# Patient Record
Sex: Female | Born: 1935
Health system: Southern US, Community
[De-identification: ages and names within clinical notes are randomized; demographics above are authoritative.]

## PROBLEM LIST (undated history)

## (undated) DIAGNOSIS — C4360 Malignant melanoma of unspecified upper limb, including shoulder: Secondary | ICD-10-CM

## (undated) DIAGNOSIS — C3492 Malignant neoplasm of unspecified part of left bronchus or lung: Secondary | ICD-10-CM

## (undated) DIAGNOSIS — R0609 Other forms of dyspnea: Secondary | ICD-10-CM

## (undated) DIAGNOSIS — E7800 Pure hypercholesterolemia, unspecified: Secondary | ICD-10-CM

## (undated) DIAGNOSIS — R112 Nausea with vomiting, unspecified: Secondary | ICD-10-CM

## (undated) DIAGNOSIS — C801 Malignant (primary) neoplasm, unspecified: Secondary | ICD-10-CM

## (undated) DIAGNOSIS — E669 Obesity, unspecified: Secondary | ICD-10-CM

## (undated) DIAGNOSIS — Z9889 Other specified postprocedural states: Secondary | ICD-10-CM

## (undated) DIAGNOSIS — F329 Major depressive disorder, single episode, unspecified: Secondary | ICD-10-CM

## (undated) DIAGNOSIS — K219 Gastro-esophageal reflux disease without esophagitis: Secondary | ICD-10-CM

## (undated) DIAGNOSIS — C3490 Malignant neoplasm of unspecified part of unspecified bronchus or lung: Secondary | ICD-10-CM

## (undated) DIAGNOSIS — F32A Depression, unspecified: Secondary | ICD-10-CM

## (undated) DIAGNOSIS — I10 Essential (primary) hypertension: Secondary | ICD-10-CM

## (undated) DIAGNOSIS — R918 Other nonspecific abnormal finding of lung field: Secondary | ICD-10-CM

## (undated) DIAGNOSIS — Z8719 Personal history of other diseases of the digestive system: Secondary | ICD-10-CM

## (undated) DIAGNOSIS — I2510 Atherosclerotic heart disease of native coronary artery without angina pectoris: Secondary | ICD-10-CM

## (undated) DIAGNOSIS — E119 Type 2 diabetes mellitus without complications: Secondary | ICD-10-CM

## (undated) DIAGNOSIS — M1990 Unspecified osteoarthritis, unspecified site: Secondary | ICD-10-CM

## (undated) DIAGNOSIS — M549 Dorsalgia, unspecified: Secondary | ICD-10-CM

## (undated) DIAGNOSIS — G8929 Other chronic pain: Secondary | ICD-10-CM

## (undated) DIAGNOSIS — C4491 Basal cell carcinoma of skin, unspecified: Secondary | ICD-10-CM

## (undated) DIAGNOSIS — F419 Anxiety disorder, unspecified: Secondary | ICD-10-CM

## (undated) DIAGNOSIS — Z9289 Personal history of other medical treatment: Secondary | ICD-10-CM

## (undated) DIAGNOSIS — R0600 Dyspnea, unspecified: Secondary | ICD-10-CM

## (undated) HISTORY — DX: Dyspnea, unspecified: R06.00

## (undated) HISTORY — PX: CHOLECYSTECTOMY OPEN: SUR202

## (undated) HISTORY — PX: MELANOMA EXCISION: SHX5266

## (undated) HISTORY — PX: BACK SURGERY: SHX140

## (undated) HISTORY — DX: Gastro-esophageal reflux disease without esophagitis: K21.9

## (undated) HISTORY — PX: DILATION AND CURETTAGE OF UTERUS: SHX78

## (undated) HISTORY — DX: Obesity, unspecified: E66.9

## (undated) HISTORY — DX: Other nonspecific abnormal finding of lung field: R91.8

## (undated) HISTORY — DX: Essential (primary) hypertension: I10

## (undated) HISTORY — DX: Major depressive disorder, single episode, unspecified: F32.9

## (undated) HISTORY — PX: BREAST BIOPSY: SHX20

## (undated) HISTORY — DX: Atherosclerotic heart disease of native coronary artery without angina pectoris: I25.10

## (undated) HISTORY — DX: Anxiety disorder, unspecified: F41.9

## (undated) HISTORY — DX: Other forms of dyspnea: R06.09

## (undated) HISTORY — DX: Pure hypercholesterolemia, unspecified: E78.00

## (undated) HISTORY — PX: BREAST LUMPECTOMY: SHX2

## (undated) HISTORY — PX: CARPAL TUNNEL RELEASE: SHX101

## (undated) HISTORY — DX: Malignant neoplasm of unspecified part of left bronchus or lung: C34.92

## (undated) HISTORY — PX: VAGINAL HYSTERECTOMY: SUR661

## (undated) HISTORY — DX: Depression, unspecified: F32.A

## (undated) HISTORY — PX: TONSILLECTOMY: SUR1361

## (undated) HISTORY — PX: CATARACT EXTRACTION W/ INTRAOCULAR LENS  IMPLANT, BILATERAL: SHX1307

---

## 2003-12-11 HISTORY — PX: CARDIAC CATHETERIZATION: SHX172

## 2015-06-05 DIAGNOSIS — E119 Type 2 diabetes mellitus without complications: Secondary | ICD-10-CM | POA: Diagnosis not present

## 2015-06-05 DIAGNOSIS — E782 Mixed hyperlipidemia: Secondary | ICD-10-CM | POA: Diagnosis not present

## 2015-06-05 DIAGNOSIS — I10 Essential (primary) hypertension: Secondary | ICD-10-CM | POA: Diagnosis not present

## 2015-06-05 DIAGNOSIS — K219 Gastro-esophageal reflux disease without esophagitis: Secondary | ICD-10-CM | POA: Diagnosis not present

## 2015-06-05 DIAGNOSIS — M1712 Unilateral primary osteoarthritis, left knee: Secondary | ICD-10-CM | POA: Diagnosis not present

## 2015-09-25 ENCOUNTER — Encounter: Payer: Self-pay | Admitting: *Deleted

## 2015-09-29 ENCOUNTER — Encounter: Payer: Self-pay | Admitting: *Deleted

## 2015-09-30 ENCOUNTER — Ambulatory Visit (INDEPENDENT_AMBULATORY_CARE_PROVIDER_SITE_OTHER): Payer: Commercial Managed Care - HMO | Admitting: Cardiovascular Disease

## 2015-09-30 ENCOUNTER — Encounter: Payer: Self-pay | Admitting: Cardiovascular Disease

## 2015-09-30 VITALS — BP 142/64 | HR 55 | Ht 68.0 in | Wt 230.0 lb

## 2015-09-30 DIAGNOSIS — I10 Essential (primary) hypertension: Secondary | ICD-10-CM | POA: Diagnosis not present

## 2015-09-30 DIAGNOSIS — R931 Abnormal findings on diagnostic imaging of heart and coronary circulation: Secondary | ICD-10-CM

## 2015-09-30 DIAGNOSIS — R0602 Shortness of breath: Secondary | ICD-10-CM | POA: Diagnosis not present

## 2015-09-30 DIAGNOSIS — E785 Hyperlipidemia, unspecified: Secondary | ICD-10-CM

## 2015-09-30 DIAGNOSIS — Z01818 Encounter for other preprocedural examination: Secondary | ICD-10-CM

## 2015-09-30 NOTE — Progress Notes (Signed)
Patient ID: Christina Estes, female   DOB: [birth year], 81 y.o.   MRN: [id number]       CARDIOLOGY CONSULT NOTE  Patient ID: Christina Estes MRN: [id number] DOB/AGE: 06/10/1935 81 y.o.  Admit date: (Not on file) Primary Physician [name], MD  Reason for Consultation: preop clearance, abnormal stress test  HPI: The patient is a 81 year old woman with a history of hypertension, hyperlipidemia , and diabetes mellitus who has been referred for preoperative risk stratification prior to undergoing knee surgery in the context of an abnormal nuclear stress test. She is planning on undergoing left total knee replacement.  Nuclear stress test on 09/15/15 demonstrated a small, mild reversible defect in the basal inferolateral region. Calculated LVEF 68%.  Echocardiogram on 02/08/13 performed in [place], [place], demonstrated vigorous left ventricular systolic function, LVEF 40-81%, with mild diastolic dysfunction.  Coronary angiography in April 2005 demonstrated mild plaque disease in the LAD and proximal left circumflex (10-20%) with 20-30% stenosis in a second diagonal branch.  ECG performed in the office today demonstrates sinus bradycardia, heart rate 47 bpm.  She denies chest pain, orthopnea, palpitations , and paroxysmal nocturnal dyspnea. She has chronic exertional dyspnea which has been unchanged over several years. She also attributes shortness of breath to back problems and bilateral knee pain.  Soc: Moved to [place] in 04/2015 from [place]. Raised in [facility]. Son died in 11/01/03, brother died in 11-01-14.  No Known Allergies  Current Outpatient Prescriptions  Medication Sig Dispense Refill  . ALPRAZolam (XANAX) 1 MG tablet Take 1 mg by mouth at bedtime as needed for anxiety.    Marland Kitchen aspirin 81 MG tablet Take 81 mg by mouth daily.    Marland Kitchen atorvastatin (LIPITOR) 40 MG tablet Take 40 mg by mouth daily.    . carvedilol (COREG) 6.25 MG tablet Take 6.25 mg by mouth 2 (two) times daily  with a meal.    . gabapentin (NEURONTIN) 400 MG capsule Take 400 mg by mouth at bedtime.    Marland Kitchen losartan-hydrochlorothiazide (HYZAAR) 100-25 MG tablet Take 1 tablet by mouth daily.    . metFORMIN (GLUCOPHAGE) 500 MG tablet Take by mouth 2 (two) times daily with a meal.    . omeprazole (PRILOSEC) 20 MG capsule Take 20 mg by mouth daily.    . Potassium 99 MG TABS Take by mouth.    . traMADol (ULTRAM) 50 MG tablet Take 50 mg by mouth every 6 (six) hours as needed.    . vitamin B-12 (CYANOCOBALAMIN) 1000 MCG tablet Take 1,000 mcg by mouth daily.     No current facility-administered medications for this visit.    Past Medical History  Diagnosis Date  . Coronary artery disease   . DOE (dyspnea on exertion)   . Hypercholesteremia   . Hypertension   . GERD (gastroesophageal reflux disease)   . Obesity   . Anxiety   . Depression     Past Surgical History  Procedure Laterality Date  . Cardiac catheterization  12/11/2003  . Cholecystectomy      Social History   Social History  . Marital Status: Married    Spouse Name: N/A  . Number of Children: N/A  . Years of Education: N/A   Occupational History  . Not on file.   Social History Main Topics  . Smoking status: Never Smoker   . Smokeless tobacco: Never Used  . Alcohol Use: No  . Drug Use: Not on file  . Sexual Activity: Not on file  Other Topics Concern  . Not on file   Social History Narrative     No family history of premature CAD in 1st degree relatives.  Prior to Admission medications   Medication Sig Start Date End Date Taking? Authorizing Provider  ALPRAZolam Duanne Moron) 1 MG tablet Take 1 mg by mouth at bedtime as needed for anxiety.    Historical Provider, MD  atorvastatin (LIPITOR) 40 MG tablet Take 40 mg by mouth daily.    Historical Provider, MD  carvedilol (COREG) 6.25 MG tablet Take 6.25 mg by mouth 2 (two) times daily with a meal.    Historical Provider, MD  losartan-hydrochlorothiazide (HYZAAR) 100-25 MG  tablet Take 1 tablet by mouth daily.    Historical Provider, MD  metFORMIN (GLUCOPHAGE) 500 MG tablet Take by mouth 2 (two) times daily with a meal.    Historical Provider, MD  omeprazole (PRILOSEC) 20 MG capsule Take 20 mg by mouth daily.    Historical Provider, MD  traMADol (ULTRAM) 50 MG tablet Take 50 mg by mouth every 6 (six) hours as needed.    Historical Provider, MD     Review of systems complete and found to be negative unless listed above in HPI     Physical exam Blood pressure 142/64, pulse 55, height '5\' 8"'$  (1.727 m), weight 230 lb (104.327 kg), SpO2 98 %. General: NAD Neck: No JVD, no thyromegaly or thyroid nodule.  Lungs: Clear to auscultation bilaterally with normal respiratory effort. CV: Nondisplaced PMI. Regular rate and rhythm, normal S1/S2, no S3/S4, no murmur.  No peripheral edema.  No carotid bruit.  Normal pedal pulses.  Abdomen: Soft, nontender, obese. Skin: Intact without lesions or rashes.  Neurologic: Alert and oriented x 3.  Psych: Normal affect. Extremities: No clubbing or cyanosis.  HEENT: Normal.   ECG: Most recent ECG reviewed.  Labs:  No results found for: WBC, HGB, HCT, MCV, PLT No results for input(s): NA, K, CL, CO2, BUN, CREATININE, CALCIUM, PROT, BILITOT, ALKPHOS, ALT, AST, GLUCOSE in the last 168 hours.  Invalid input(s): LABALBU No results found for: CKTOTAL, CKMB, CKMBINDEX, TROPONINI No results found for: CHOL No results found for: HDL No results found for: LDLCALC No results found for: TRIG No results found for: CHOLHDL No results found for: LDLDIRECT       Studies: No results found.  ASSESSMENT AND PLAN:  1. Abnormal nuclear stress test: Given the small area of mild reversibility with normal LV systolic function, it appears to be a low risk study for which medical management is warranted. Will obtain an echocardiogram to assess for interval changes in cardiac structure and function. CAD was mild by cath in 2005. Continue ASA,  Coreg, and Lipitor.  2. Essential HTN: Mildly elevated today. Will monitor.  3. Hyperlipidemia: Continue Lipitor 40 mg.  4. Preoperative risk stratification: She can proceed with knee surgery as planned at an acceptable level of risk. Will obtain an echocardiogram to assess for interval changes in cardiac structure and function.  5. Shortness of breath: Appears to be chronic and unchanged. Will obtain an echocardiogram to assess for interval changes in cardiac structure and function.  Dispo: f/u 6 months.   Signed: Kate Sable, M.D., F.A.C.C.  09/30/2015, 2:47 PM

## 2015-10-01 ENCOUNTER — Other Ambulatory Visit: Payer: Self-pay

## 2015-10-01 ENCOUNTER — Ambulatory Visit (INDEPENDENT_AMBULATORY_CARE_PROVIDER_SITE_OTHER): Payer: Commercial Managed Care - HMO

## 2015-10-01 DIAGNOSIS — R0602 Shortness of breath: Secondary | ICD-10-CM

## 2015-10-01 DIAGNOSIS — R931 Abnormal findings on diagnostic imaging of heart and coronary circulation: Secondary | ICD-10-CM

## 2015-10-02 ENCOUNTER — Telehealth: Payer: Self-pay | Admitting: *Deleted

## 2015-10-02 NOTE — Telephone Encounter (Signed)
Notes Recorded by Laurine Blazer, LPN on 04/02/5908 at 3:11 PM Patient notified. Copy to pmd & Dr. Case.

## 2015-10-02 NOTE — Telephone Encounter (Signed)
-----   Message from [place] sent at 10/02/2015 11:46 AM EST -----   ----- Message -----    From: Herminio Commons, MD    Sent: 10/02/2015  11:26 AM      To: Massie Maroon, CMA  Normal pumping function with some stiffness with relaxation. Manage medically.

## 2016-03-30 ENCOUNTER — Ambulatory Visit: Payer: Commercial Managed Care - HMO | Admitting: Cardiovascular Disease

## 2016-04-22 ENCOUNTER — Encounter (HOSPITAL_COMMUNITY): Payer: Commercial Managed Care - HMO | Attending: Oncology | Admitting: Oncology

## 2016-04-22 ENCOUNTER — Encounter (HOSPITAL_COMMUNITY): Payer: Self-pay | Admitting: Oncology

## 2016-04-22 VITALS — BP 140/75 | HR 68 | Temp 98.3°F | Resp 18 | Ht 68.0 in | Wt 220.0 lb

## 2016-04-22 DIAGNOSIS — Z9049 Acquired absence of other specified parts of digestive tract: Secondary | ICD-10-CM | POA: Insufficient documentation

## 2016-04-22 DIAGNOSIS — S22000A Wedge compression fracture of unspecified thoracic vertebra, initial encounter for closed fracture: Secondary | ICD-10-CM

## 2016-04-22 DIAGNOSIS — M545 Low back pain: Secondary | ICD-10-CM | POA: Insufficient documentation

## 2016-04-22 DIAGNOSIS — M546 Pain in thoracic spine: Secondary | ICD-10-CM | POA: Insufficient documentation

## 2016-04-22 DIAGNOSIS — C7951 Secondary malignant neoplasm of bone: Secondary | ICD-10-CM | POA: Insufficient documentation

## 2016-04-22 DIAGNOSIS — F329 Major depressive disorder, single episode, unspecified: Secondary | ICD-10-CM | POA: Insufficient documentation

## 2016-04-22 DIAGNOSIS — R627 Adult failure to thrive: Secondary | ICD-10-CM | POA: Insufficient documentation

## 2016-04-22 DIAGNOSIS — R634 Abnormal weight loss: Secondary | ICD-10-CM

## 2016-04-22 DIAGNOSIS — E7800 Pure hypercholesterolemia, unspecified: Secondary | ICD-10-CM | POA: Insufficient documentation

## 2016-04-22 DIAGNOSIS — K259 Gastric ulcer, unspecified as acute or chronic, without hemorrhage or perforation: Secondary | ICD-10-CM | POA: Insufficient documentation

## 2016-04-22 DIAGNOSIS — Z85828 Personal history of other malignant neoplasm of skin: Secondary | ICD-10-CM

## 2016-04-22 DIAGNOSIS — R1310 Dysphagia, unspecified: Secondary | ICD-10-CM | POA: Insufficient documentation

## 2016-04-22 DIAGNOSIS — R339 Retention of urine, unspecified: Secondary | ICD-10-CM | POA: Insufficient documentation

## 2016-04-22 DIAGNOSIS — R918 Other nonspecific abnormal finding of lung field: Secondary | ICD-10-CM | POA: Diagnosis not present

## 2016-04-22 DIAGNOSIS — E119 Type 2 diabetes mellitus without complications: Secondary | ICD-10-CM | POA: Insufficient documentation

## 2016-04-22 DIAGNOSIS — Z8711 Personal history of peptic ulcer disease: Secondary | ICD-10-CM | POA: Insufficient documentation

## 2016-04-22 DIAGNOSIS — C3492 Malignant neoplasm of unspecified part of left bronchus or lung: Secondary | ICD-10-CM | POA: Insufficient documentation

## 2016-04-22 DIAGNOSIS — C4362 Malignant melanoma of left upper limb, including shoulder: Secondary | ICD-10-CM | POA: Insufficient documentation

## 2016-04-22 DIAGNOSIS — I2510 Atherosclerotic heart disease of native coronary artery without angina pectoris: Secondary | ICD-10-CM | POA: Insufficient documentation

## 2016-04-22 DIAGNOSIS — N179 Acute kidney failure, unspecified: Secondary | ICD-10-CM | POA: Insufficient documentation

## 2016-04-22 DIAGNOSIS — Z7984 Long term (current) use of oral hypoglycemic drugs: Secondary | ICD-10-CM | POA: Insufficient documentation

## 2016-04-22 DIAGNOSIS — F419 Anxiety disorder, unspecified: Secondary | ICD-10-CM | POA: Insufficient documentation

## 2016-04-22 DIAGNOSIS — K219 Gastro-esophageal reflux disease without esophagitis: Secondary | ICD-10-CM | POA: Insufficient documentation

## 2016-04-22 DIAGNOSIS — K829 Disease of gallbladder, unspecified: Secondary | ICD-10-CM | POA: Insufficient documentation

## 2016-04-22 DIAGNOSIS — Z9889 Other specified postprocedural states: Secondary | ICD-10-CM | POA: Insufficient documentation

## 2016-04-22 DIAGNOSIS — I10 Essential (primary) hypertension: Secondary | ICD-10-CM | POA: Insufficient documentation

## 2016-04-22 DIAGNOSIS — G893 Neoplasm related pain (acute) (chronic): Secondary | ICD-10-CM

## 2016-04-22 DIAGNOSIS — Z79899 Other long term (current) drug therapy: Secondary | ICD-10-CM | POA: Insufficient documentation

## 2016-04-22 DIAGNOSIS — F4323 Adjustment disorder with mixed anxiety and depressed mood: Secondary | ICD-10-CM | POA: Insufficient documentation

## 2016-04-22 DIAGNOSIS — E669 Obesity, unspecified: Secondary | ICD-10-CM | POA: Insufficient documentation

## 2016-04-22 HISTORY — DX: Other nonspecific abnormal finding of lung field: R91.8

## 2016-04-22 MED ORDER — FENTANYL 12 MCG/HR TD PT72: 12.5000 ug | MEDICATED_PATCH | TRANSDERMAL | 0 refills | Status: DC

## 2016-04-22 MED ORDER — OXYCODONE HCL 5 MG PO TABS: 5.0000 mg | ORAL_TABLET | ORAL | 0 refills | Status: DC | PRN

## 2016-04-22 NOTE — Patient Instructions (Addendum)
[place] at [facility] Discharge Instructions  RECOMMENDATIONS MADE BY THE CONSULTANT AND ANY TEST RESULTS WILL BE SENT TO YOUR REFERRING PHYSICIAN.  You were seen by Gershon Mussel Today We gave you a constipation sheet. We gave you a script for the fentany patch and oxycodone.  Will schedule you for a MRI of the Brain Return in 2 weeks for your follow up. Please call the center with any related concerns  Thank you for choosing [place] at [facility] to provide your oncology and hematology care.  To afford each patient quality time with our provider, please arrive at least 15 minutes before your scheduled appointment time.   Beginning January 23rd 2017 lab work for the [facility] will be done in the  Main lab at [facility] on 1st floor. If you have a lab appointment with the [facility] please come in thru the  Main Entrance and check in at the main information desk  You need to re-schedule your appointment should you arrive 10 or more minutes late.  We strive to give you quality time with our providers, and arriving late affects you and other patients whose appointments are after yours.  Also, if you no show three or more times for appointments you may be dismissed from the clinic at the providers discretion.     Again, thank you for choosing [facility].  Our hope is that these requests will decrease the amount of time that you wait before being seen by our physicians.       _____________________________________________________________  Should you have questions after your visit to [facility], please contact our office at [phone] between the hours of 8:30 a.m. and 4:30 p.m.  Voicemails left after 4:30 p.m. will not be returned until the following business day.  For prescription refill requests, have your pharmacy contact our office.         Resources For Cancer Patients and their Caregivers ? American  Cancer Society: Can assist with transportation, wigs, general needs, runs Look Good Feel Better.        442-399-6407 ? Cancer Care: Provides financial assistance, online support groups, medication/co-pay assistance.  1-800-813-HOPE (515) 416-3740) ? [place] Assists [place] cancer patients and their families through emotional , educational and financial support.  330-637-6418 ? [place] DSS Where to apply for food stamps, Medicaid and utility assistance. (956)177-3064 ? [facility]: Transportation to medical appointments. 978 649 2964 ? Social Security Administration: May apply for disability if have a Stage IV cancer. 319 393 1020 (347)559-9410 ? [employer], Disability and Transit Services: Assists with nutrition, care and transit needs. [place] Support Programs: '@10RELATIVEDAYS'$ @ > Cancer Support Group  2nd Tuesday of the month 1pm-2pm, Journey Room  > Creative Journey  3rd Tuesday of the month 1130am-1pm, Journey Room  > Look Good Feel Better  1st Wednesday of the month 10am-12 noon, Journey Room (Call Marie to register [phone])

## 2016-04-22 NOTE — Assessment & Plan Note (Addendum)
Large LLL pulmonary mass with spiculated margins measuring 5 cm in largest dimension with upper thoracic compression deformity of vertebral body suspicious for pathologic fracture.  Labs from [facility] hospital reviewed.  Mild hypercalcemia noted with normal albumin.  Labs in ~ 2 weeks: CBC diff, CMET.   Order is placed for MRI brain w and wo contrast for complete staging.  I have messaged Dr. Tammi Klippel (Rad Onc) to consider palliative XRT to thoracic vertebral body for pain control.  I have messaged Dr. Roxan Hockey about getting the patient in with him for biopsy for diagnosis.  Given her pain, we will work on improved pain control.  Rx for Fentanyl 12.5 mcg every 72 hours and Oxy-IR 5 mg every 4 hours PRN breakthrough pain management.  She is given a constipation sheet since she will be started on narcotics.  Return in ~ 2 weeks for follow-up.

## 2016-04-22 NOTE — Progress Notes (Signed)
[facility] Hematology/Oncology Consultation   Name: Christina Estes      MRN: [id number]    Date: 04/23/2016 Time:9:07 PM   REFERRING PHYSICIAN:  Judd Lien, MD (Primary Care Provider)  REASON FOR CONSULT:  Lung mass   DIAGNOSIS:  5 cm spiculated mass in the left lower lobe with possible pathologic fracture of upper thoracic vertebral body with lucency.  HISTORY OF PRESENT ILLNESS:   Christina Estes is a 81 y.o. female with a medical history significant for DM, HTN, GERD, PUD who is referred to the [facility] following discharge from [facility] on 04/22/2016 with CT imaging demonstrating a 5 cm spiculated mass in the left lower lobe with upper thoracic vertebral body lucency with mild compression deformity concerning for pathologic fracture.  The patient reports that she went on a trip to [place] and [place] in June/July 2017. Following return from these trips, she notes that she did not feel right. In August 2017, she presented to [facility] with issues including dysphagia, indigestion, and back pain. She was evaluated by GI, Dr. Britta Mccreedy, who performed an EGD and found to ulcers. Her medications were adjusted accordingly. She reports that her back pain persisted and over the past 2 or 3 weeks, it is gotten significantly worse. She notes that she suffers from chronic back pain as a result of back surgery years ago. The patient reports a 30 pound weight loss since July 2017. Approximately 1-2 weeks later she presented to her primary care physician with continued symptoms including back pain and failure to thrive. Dr. Pleas Koch subsequently admitted the patient for IV fluids at which time CT scans were performed that demonstrated a large left lower lobe 5 cm spiculated mass with upper thoracic vertebral body compression deformity with lucency concerning for possible metastatic lesion and pathologic fracture. She was admitted for pain control and  discharged today, 04/22/2016 with an urgent new patient consultation with [place] this afternoon.  She notes a history of multiple basal cell carcinoma skin cancers that have been removed and managed by Dr. Tarri Glenn (dermatologist). This year, she notes a left upper extremity skin lesion that was removed and positive for melanoma.  She notes continued low back pain. She is in a wheelchair today. She notes that she is able to ambulate if necessary but this increases her back pain. She does have control of bowel and bladder. She denies any neurological deficits of her lower or upper extremities.   Review of Systems  Constitutional: Positive for weight loss (30 lbs x 2 months). Negative for chills and fever.  HENT: Negative.   Eyes: Negative.  Negative for blurred vision and double vision.  Respiratory: Negative.  Negative for cough, hemoptysis, sputum production, shortness of breath and wheezing.   Cardiovascular: Negative.  Negative for chest pain and leg swelling.  Gastrointestinal: Positive for nausea. Negative for abdominal pain and vomiting.  Genitourinary: Negative.   Musculoskeletal: Positive for back pain.  Skin: Negative.   Neurological: Positive for weakness. Negative for dizziness, sensory change, speech change, focal weakness, seizures, loss of consciousness and headaches.  Endo/Heme/Allergies: Negative.   Psychiatric/Behavioral: Negative.   14 point review of systems was performed and is negative except as detailed under history of present illness and above   PAST MEDICAL HISTORY:   Past Medical History:  Diagnosis Date  . Anxiety   . Coronary artery disease   . Depression   . Diabetes mellitus without complication ([place])   .  DOE (dyspnea on exertion)   . GERD (gastroesophageal reflux disease)   . Hypercholesteremia   . Hypertension   . Lung mass 04/22/2016  . Obesity     ALLERGIES: No Known Allergies    MEDICATIONS: I have reviewed the patient's current medications.     Current Outpatient Prescriptions on File Prior to Visit  Medication Sig Dispense Refill  . ALPRAZolam (XANAX) 1 MG tablet Take 1 mg by mouth at bedtime as needed for anxiety.    Marland Kitchen atorvastatin (LIPITOR) 40 MG tablet Take 40 mg by mouth daily.    . carvedilol (COREG) 6.25 MG tablet Take 6.25 mg by mouth 2 (two) times daily with a meal.    . gabapentin (NEURONTIN) 400 MG capsule Take 400 mg by mouth at bedtime.    Marland Kitchen losartan-hydrochlorothiazide (HYZAAR) 100-25 MG tablet Take 1 tablet by mouth daily.    . metFORMIN (GLUCOPHAGE) 500 MG tablet Take by mouth 2 (two) times daily with a meal.    . traMADol (ULTRAM) 50 MG tablet Take 50 mg by mouth every 6 (six) hours as needed.    . vitamin B-12 (CYANOCOBALAMIN) 1000 MCG tablet Take 1,000 mcg by mouth daily.     No current facility-administered medications on file prior to visit.      PAST SURGICAL HISTORY Past Surgical History:  Procedure Laterality Date  . CARDIAC CATHETERIZATION  12/11/2003  . CHOLECYSTECTOMY      FAMILY HISTORY: Family History  Problem Relation Age of Onset  . Cancer Mother   . Diabetes Mellitus II Son   . Cancer Brother    Mother deceased at age of 4 from "old age." Father deceased at age of 43 from [place].   1 brother deceased at age of 43 from prostate cancer. 7 children, 6 living. 2 grandchildren and 3 great-grandchildren.  SOCIAL HISTORY: NEVER smoker.  NEVER drinker.  No illicit drug abuse.  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  [employer].  Married.  Social History   Social History  . Marital status: Married    Spouse name: N/A  . Number of children: N/A  . Years of education: N/A   Social History Main Topics  . Smoking status: Never Smoker  . Smokeless tobacco: Never Used  . Alcohol use No  . Drug use: Unknown  . Sexual activity: Not Asked   Other Topics Concern  . None   Social History Narrative  . None    PERFORMANCE STATUS: The patient's performance status is 2 - Symptomatic, <50%  confined to bed  PHYSICAL EXAM: Most Recent Vital Signs: Blood pressure 140/75, pulse 68, temperature 98.3 F (36.8 C), temperature source Oral, resp. rate 18, height _0  (1.727 m), weight 220 lb (99.8 kg), SpO2 95 %. General appearance: alert, cooperative, appears stated age, no distress, moderately obese, pale and in wheelchair and accompanied by husband Head: Normocephalic, without obvious abnormality, atraumatic Eyes: negative findings: lids and lashes normal, conjunctivae and sclerae normal and corneas clear Throat: normal findings: lips normal without lesions, buccal mucosa normal and oropharynx pink & moist without lesions or evidence of thrush Neck: no adenopathy and supple, symmetrical, trachea midline Lungs: clear to auscultation bilaterally Heart: regular rate and rhythm, S1, S2 normal, no murmur, click, rub or gallop Abdomen: normal findings: no masses palpable, no organomegaly and soft, non-tender and abnormal findings:  obese Extremities: extremities normal, atraumatic, no cyanosis or edema and left hip flexion is decreased, otherwise right hip flexion, B/L knee flexion/extension 5/5 Skin: Skin color, texture, turgor  normal. No rashes or lesions or left lateral upper arm healed surgical site Lymph nodes: Cervical, supraclavicular, and axillary nodes normal. Neurologic: Grossly normal  LABORATORY DATA:         RADIOGRAPHY:  CT SCAN FROM [place] [place]:                PATHOLOGY:  N/A  ASSESSMENT/PLAN:   Lung mass Significant Weight loss Thoracic compression fracture Cancer related pain Midline thoracic pain  Large LLL pulmonary mass with spiculated margins measuring 5 cm in largest dimension with upper thoracic compression deformity of vertebral body suspicious for pathologic fracture.  Labs from [facility] hospital reviewed.  Mild hypercalcemia noted with normal albumin.  Labs in ~ 2 weeks: CBC diff, CMET.   Order is placed for MRI brain w  and wo contrast for complete staging.  I have messaged Dr. Tammi Klippel (Rad Onc) to consider palliative XRT to thoracic vertebral body for pain control.  I have messaged Dr. Roxan Hockey about getting the patient in with him for biopsy for diagnosis. Patient is a never smoker, I would suspect she may have and EGFR mutated tumor. We will be sending biopsy off for fondation one testing. I do not feel she would tolerate traditional chemotherapy well, however she may do quite well with targeted therapy which can also work quite dramatically.   Given her pain, we will work on improved pain control.  Rx for Fentanyl 12.5 mcg every 72 hours and Oxy-IR 5 mg every 4 hours PRN breakthrough pain management.  She is given a constipation sheet since she will be started on narcotics.  Return in ~ 2 weeks for follow-up. She will meet Anderson Malta, our patient navigator today.   Discussed with patient and family that clinical findings are certainly suspicious for stage IV, incurable lung cancer. They do understand this. We will attempt to move forward as quickly as possible with completion of staging and diagnosis.    ORDERS PLACED FOR THIS ENCOUNTER: Orders Placed This Encounter  Procedures  . MR Brain W Wo Contrast  . CBC with Differential  . Comprehensive metabolic panel    All questions were answered. The patient knows to call the clinic with any problems, questions or concerns. We can certainly see the patient much sooner if necessary.  This note is electronically signed WI:[email or web address] Cyril Mourning, MD  04/23/2016 9:07 PM

## 2016-04-23 ENCOUNTER — Encounter (HOSPITAL_COMMUNITY): Payer: Self-pay | Admitting: Lab

## 2016-04-23 NOTE — Progress Notes (Unsigned)
Referral to [facility].  Records faxed on 9/8.

## 2016-04-25 ENCOUNTER — Inpatient Hospital Stay (HOSPITAL_COMMUNITY)
Admission: EM | Admit: 2016-04-25 | Discharge: 2016-05-01 | DRG: 167 | Disposition: A | Discharge status: Skilled Nursing Facility | Payer: Commercial Managed Care - HMO | Attending: Internal Medicine | Admitting: Internal Medicine

## 2016-04-25 ENCOUNTER — Encounter (HOSPITAL_COMMUNITY): Payer: Self-pay

## 2016-04-25 DIAGNOSIS — I10 Essential (primary) hypertension: Secondary | ICD-10-CM | POA: Diagnosis present

## 2016-04-25 DIAGNOSIS — R4182 Altered mental status, unspecified: Secondary | ICD-10-CM

## 2016-04-25 DIAGNOSIS — Z419 Encounter for procedure for purposes other than remedying health state, unspecified: Secondary | ICD-10-CM

## 2016-04-25 DIAGNOSIS — R339 Retention of urine, unspecified: Secondary | ICD-10-CM | POA: Diagnosis not present

## 2016-04-25 DIAGNOSIS — M549 Dorsalgia, unspecified: Secondary | ICD-10-CM

## 2016-04-25 DIAGNOSIS — K5903 Drug induced constipation: Secondary | ICD-10-CM | POA: Diagnosis present

## 2016-04-25 DIAGNOSIS — Z6835 Body mass index (BMI) 35.0-35.9, adult: Secondary | ICD-10-CM

## 2016-04-25 DIAGNOSIS — C7951 Secondary malignant neoplasm of bone: Secondary | ICD-10-CM | POA: Diagnosis present

## 2016-04-25 DIAGNOSIS — Z79891 Long term (current) use of opiate analgesic: Secondary | ICD-10-CM

## 2016-04-25 DIAGNOSIS — Y92019 Unspecified place in single-family (private) house as the place of occurrence of the external cause: Secondary | ICD-10-CM

## 2016-04-25 DIAGNOSIS — I2510 Atherosclerotic heart disease of native coronary artery without angina pectoris: Secondary | ICD-10-CM | POA: Diagnosis present

## 2016-04-25 DIAGNOSIS — Z808 Family history of malignant neoplasm of other organs or systems: Secondary | ICD-10-CM

## 2016-04-25 DIAGNOSIS — Z9049 Acquired absence of other specified parts of digestive tract: Secondary | ICD-10-CM

## 2016-04-25 DIAGNOSIS — C3432 Malignant neoplasm of lower lobe, left bronchus or lung: Secondary | ICD-10-CM | POA: Diagnosis not present

## 2016-04-25 DIAGNOSIS — N139 Obstructive and reflux uropathy, unspecified: Secondary | ICD-10-CM | POA: Diagnosis present

## 2016-04-25 DIAGNOSIS — E119 Type 2 diabetes mellitus without complications: Secondary | ICD-10-CM | POA: Diagnosis present

## 2016-04-25 DIAGNOSIS — C799 Secondary malignant neoplasm of unspecified site: Secondary | ICD-10-CM

## 2016-04-25 DIAGNOSIS — E669 Obesity, unspecified: Secondary | ICD-10-CM | POA: Diagnosis present

## 2016-04-25 DIAGNOSIS — C3492 Malignant neoplasm of unspecified part of left bronchus or lung: Secondary | ICD-10-CM | POA: Diagnosis present

## 2016-04-25 DIAGNOSIS — Z833 Family history of diabetes mellitus: Secondary | ICD-10-CM

## 2016-04-25 DIAGNOSIS — F4323 Adjustment disorder with mixed anxiety and depressed mood: Secondary | ICD-10-CM | POA: Diagnosis present

## 2016-04-25 DIAGNOSIS — R338 Other retention of urine: Secondary | ICD-10-CM

## 2016-04-25 DIAGNOSIS — M8458XA Pathological fracture in neoplastic disease, other specified site, initial encounter for fracture: Secondary | ICD-10-CM | POA: Diagnosis present

## 2016-04-25 DIAGNOSIS — N179 Acute kidney failure, unspecified: Secondary | ICD-10-CM | POA: Diagnosis present

## 2016-04-25 DIAGNOSIS — K219 Gastro-esophageal reflux disease without esophagitis: Secondary | ICD-10-CM | POA: Diagnosis present

## 2016-04-25 DIAGNOSIS — C7931 Secondary malignant neoplasm of brain: Secondary | ICD-10-CM | POA: Diagnosis present

## 2016-04-25 DIAGNOSIS — R330 Drug induced retention of urine: Secondary | ICD-10-CM | POA: Diagnosis present

## 2016-04-25 DIAGNOSIS — Z8249 Family history of ischemic heart disease and other diseases of the circulatory system: Secondary | ICD-10-CM

## 2016-04-25 DIAGNOSIS — T404X5A Adverse effect of other synthetic narcotics, initial encounter: Secondary | ICD-10-CM | POA: Diagnosis present

## 2016-04-25 DIAGNOSIS — E7800 Pure hypercholesterolemia, unspecified: Secondary | ICD-10-CM | POA: Diagnosis present

## 2016-04-25 DIAGNOSIS — M5136 Other intervertebral disc degeneration, lumbar region: Secondary | ICD-10-CM | POA: Diagnosis present

## 2016-04-25 DIAGNOSIS — G893 Neoplasm related pain (acute) (chronic): Secondary | ICD-10-CM | POA: Diagnosis present

## 2016-04-25 DIAGNOSIS — Z7984 Long term (current) use of oral hypoglycemic drugs: Secondary | ICD-10-CM

## 2016-04-25 DIAGNOSIS — C801 Malignant (primary) neoplasm, unspecified: Secondary | ICD-10-CM

## 2016-04-25 LAB — URINALYSIS, ROUTINE W REFLEX MICROSCOPIC
BILIRUBIN URINE: NEGATIVE
Glucose, UA: NEGATIVE mg/dL
HGB URINE DIPSTICK: NEGATIVE
Leukocytes, UA: NEGATIVE
NITRITE: NEGATIVE
PROTEIN: NEGATIVE mg/dL
pH: 5.5 (ref 5.0–8.0)

## 2016-04-25 MED ORDER — SODIUM CHLORIDE 0.9 % IV BOLUS (SEPSIS)
1000.0000 mL | Freq: Once | INTRAVENOUS | Status: AC
  Administered 2016-04-26: 1000 mL via INTRAVENOUS

## 2016-04-25 NOTE — ED Provider Notes (Addendum)
By signing my name below, I, Christina Estes, attest that this documentation has been prepared under the direction and in the presence of [name], DO. Electronically Signed: Reola Estes, ED Scribe. 04/25/16. 11:42 PM.  TIME SEEN: 11:33 PM  CHIEF COMPLAINT:  Chief Complaint  Patient presents with  . Urinary Retention   HPI Comments: Christina Estes is a 81 y.o. female BIB [facility], with a  PMHx of HTN, who presents to the Emergency Department complaining of gradually worsening urinary retention x ~3-4 days. Associated symptoms include Increasing back pain over several weeks and lower abdominal pain. Husband reports that the pt was recently dx'd with lung CA (lesion seen on chest x-ray) and an upper thoracic pathologic fracture, and is scheduled to have a biopsy soon.  She was recently rx'd fentanyl patches and oxycodone for her pain by [name] PA with oncology. Husband additionally reports that over the last few days the pt has been unable to ambulate and is refusing to stand.  They think this may be because of her pain. Normally she is able to angulate without any assistance, cane or walker. Pt's last bowel movement was approximately 1-2 days ago.  They are not sure if she is passing gas. She has not tried any treatments for constipation. Per oncology note she was discharged with a bowel regimen but hasn't states he has "picked it up". Family at bedside also note that the patient has been moderately confused since using her pain medication. Pt is a nonsmoker. Denies fever, emesis, diarrhea, or any other associated symptoms.   PCP: Christina Labrum, MD in [place]  ROS: See HPI Constitutional: no fever  Eyes: no drainage  ENT: no runny nose   Cardiovascular:  no chest pain  Resp: no SOB  GI: no vomiting GU: no dysuria Integumentary: no rash  Allergy: no hives  Musculoskeletal: no leg swelling  Neurological: no slurred speech ROS otherwise negative  PAST MEDICAL  HISTORY/PAST SURGICAL HISTORY:  Past Medical History:  Diagnosis Date  . Anxiety   . Coronary artery disease   . Depression   . Diabetes mellitus without complication ([name])   . DOE (dyspnea on exertion)   . GERD (gastroesophageal reflux disease)   . Hypercholesteremia   . Hypertension   . Lung mass 04/22/2016  . Obesity    MEDICATIONS:  Prior to Admission medications   Medication Sig Start Date End Date Taking? Authorizing Provider  ALPRAZolam Duanne Moron) 1 MG tablet Take 1 mg by mouth at bedtime as needed for anxiety.    Historical Provider, MD  amoxicillin (AMOXIL) 500 MG capsule  04/15/16   Historical Provider, MD  atorvastatin (LIPITOR) 40 MG tablet Take 40 mg by mouth daily.    Historical Provider, MD  carvedilol (COREG) 6.25 MG tablet Take 6.25 mg by mouth 2 (two) times daily with a meal.    Historical Provider, MD  clarithromycin (BIAXIN) 500 MG tablet  04/15/16   Historical Provider, MD  cyclobenzaprine (FLEXERIL) 10 MG tablet  04/14/16   Historical Provider, MD  diclofenac (VOLTAREN) 75 MG EC tablet  03/23/16   Historical Provider, MD  esomeprazole (NEXIUM) 40 MG capsule  04/15/16   Historical Provider, MD  fentaNYL (DURAGESIC - DOSED MCG/HR) 12 MCG/HR Place 1 patch (12.5 mcg total) onto the skin every 3 (three) days. 04/22/16   [name], PA-C  gabapentin (NEURONTIN) 400 MG capsule Take 400 mg by mouth at bedtime.    Historical Provider, MD  losartan-hydrochlorothiazide [name])  100-25 MG tablet Take 1 tablet by mouth daily.    Historical Provider, MD  metFORMIN (GLUCOPHAGE) 500 MG tablet Take by mouth 2 (two) times daily with a meal.    Historical Provider, MD  omeprazole (PRILOSEC) 20 MG capsule  04/12/16   Historical Provider, MD  oxyCODONE (OXY IR/ROXICODONE) 5 MG immediate release tablet Take 1 tablet (5 mg total) by mouth every 4 (four) hours as needed for severe pain. 04/22/16   [name], PA-C  predniSONE (STERAPRED UNI-PAK 21 TAB) 10 MG (21) TBPK tablet  03/10/16    Historical Provider, MD  traMADol (ULTRAM) 50 MG tablet Take 50 mg by mouth every 6 (six) hours as needed.    Historical Provider, MD  vitamin B-12 (CYANOCOBALAMIN) 1000 MCG tablet Take 1,000 mcg by mouth daily.    Historical Provider, MD   ALLERGIES:  No Known Allergies  SOCIAL HISTORY:  Social History  Substance Use Topics  . Smoking status: Never Smoker  . Smokeless tobacco: Never Used  . Alcohol use No   FAMILY HISTORY: Family History  Problem Relation Age of Onset  . Cancer Mother   . Diabetes Mellitus II Son   . Cancer Brother    EXAM: BP (!) 97/48 (BP Location: Left Arm)   Pulse (!) 59   Temp 98.8 F (37.1 C) (Oral)   Resp 15   Ht '5\' 8"'$  (1.727 m)   Wt 225 lb (102.1 kg)   SpO2 95%   BMI 34.21 kg/m  CONSTITUTIONAL: Alert and responds appropriately to questions. Well-nourished; Elderly; Oriented to person and place, but not time; Chronically ill-appearing, in no distress HEAD: Normocephalic EYES: Conjunctivae clear, PERRL ENT: normal nose; no rhinorrhea; moist mucous membranes NECK: Supple, no meningismus, no LAD  CARD: RRR; S1 and S2 appreciated; no murmurs, no clicks, no rubs, no gallops RESP: Normal chest excursion without splinting or tachypnea; breath sounds clear and equal bilaterally; no wheezes, no rhonchi, no rales, no hypoxia or respiratory distress, speaking full sentences ABD/GI: Normal bowel sounds; non-distended; soft, non-tender, no rebound, no guarding, no peritoneal signs; indwelling Foley catheter in place RECTAL:  Slightly diminished rectal tone, no gross blood or melena, no hemorrhoids appreciated, nontender rectal exam, no fecal impaction BACK:  The back appears normal, there is no CVA tenderness; TTP throughout T and L-spine w/o step-off or deformity  EXT: Normal ROM in all joints; non-tender to palpation; no edema; normal capillary refill; no cyanosis, no calf tenderness or swelling    SKIN: Normal color for age and race; warm; no rash NEURO:  Moves all extremities equally, sensation to light touch intact diffusely, cranial nerves II through XII intact; diminished reflexes in bilateral LEs, denies any saddle anesthesia, has difficulty lifting her legs off of the bed but is difficult to determinate if this is secondary to weakness or pain PSYCH: The patient's mood and manner are appropriate. Grooming and personal hygiene are appropriate.  MEDICAL DECISION MAKING: Patient here with urinary retention, increasing back pain, decreased bowel movements from baseline, altered mental status. Discussed with family that I think that this could be all from her pain medication causing constipation and her back pain could be from her pathologic fracture. However I am unable to rule out that there is any compression on her spinal cord, cauda equina. I doubt there is any infectious etiology given no fever. Has a rectal temp of 99.5. Will obtain labs, x-rays of her thoracic and lumbar spine to see if there is any acute change, x-ray  of her abdomen. No fecal impaction on exam. Foley catheter has been placed by nursing staff given her urinary retention with a volume over 500 mL.  ED PROGRESS: Labs showed a creatinine of greater than 4 with elevated BUN. I have received records from [facility] which show she had a creatinine of 0.72 on 04/22/16. Suspect this is postobstructive uropathy. We'll give gentle IV hydration.   X-ray show constipation. There is opacity in the left lung. No fever, cough to suggest pneumonia. This is the same location of spiculated 5 cm mass seen on CT scan at that hospital per oncology notes. At this time I do not feel she needs to be started on antibiotics. She does have a mild leukocytosis but again my suspicion for infection at this time is low at feel this can be monitored closely. There is a T4 compression fracture seen on a thoracic x-ray. Lumbar x-ray shows no acute abnormality.   Given my concern for possible spinal  compression, cauda equina, I feel she will need MRI without contrast of her thoracic and lumbar spine. Discussed with Dr. Randal Buba at [facility] emergency department who agrees to accept her in transfer. I have also discussed the case with Dr. Trenton Gammon who is on call for neurosurgery who agrees with obtaining MRI and calling back if needed.   Patient will need admission for acute renal failure. Patient and husband are aware of this. They would like to stay at [facility] cone for admission. Husband also feels like he is unable to care for her at home and is interested in placement, rehabilitation after inpatient hospitalization.    I personally performed the services described in this documentation, which was scribed in my presence. The recorded information has been reviewed and is accurate.          [place], DO 04/26/16 0250     5:50 AM  Pt at [facility] getting MRI.  Pt's husband was initially okay with her being admitted at [facility] but would now like her transferred back to [facility] for admission with no neurosurgical emergency or other reason for admission at [facility].  Husband is Shaely Gadberry [phone] cell.   [place], DO 04/26/16 [place] Domenico Achord, DO 04/27/16 0041

## 2016-04-25 NOTE — ED Triage Notes (Signed)
Pt. Came in via [facility] with complaints of not being able to urinate X3 days. Denies abdominal pain denies fever. Bladder scan shows 516 ml in bladder.

## 2016-04-26 ENCOUNTER — Encounter: Payer: Commercial Managed Care - HMO | Admitting: Thoracic Surgery (Cardiothoracic Vascular Surgery)

## 2016-04-26 ENCOUNTER — Inpatient Hospital Stay (HOSPITAL_COMMUNITY): Payer: Commercial Managed Care - HMO

## 2016-04-26 ENCOUNTER — Emergency Department (HOSPITAL_COMMUNITY): Payer: Commercial Managed Care - HMO

## 2016-04-26 ENCOUNTER — Other Ambulatory Visit (HOSPITAL_COMMUNITY): Payer: Self-pay

## 2016-04-26 DIAGNOSIS — Z6835 Body mass index (BMI) 35.0-35.9, adult: Secondary | ICD-10-CM | POA: Diagnosis not present

## 2016-04-26 DIAGNOSIS — G893 Neoplasm related pain (acute) (chronic): Secondary | ICD-10-CM | POA: Diagnosis present

## 2016-04-26 DIAGNOSIS — C7931 Secondary malignant neoplasm of brain: Secondary | ICD-10-CM | POA: Diagnosis present

## 2016-04-26 DIAGNOSIS — C801 Malignant (primary) neoplasm, unspecified: Secondary | ICD-10-CM | POA: Diagnosis not present

## 2016-04-26 DIAGNOSIS — E669 Obesity, unspecified: Secondary | ICD-10-CM | POA: Diagnosis present

## 2016-04-26 DIAGNOSIS — C7951 Secondary malignant neoplasm of bone: Secondary | ICD-10-CM | POA: Diagnosis present

## 2016-04-26 DIAGNOSIS — Z808 Family history of malignant neoplasm of other organs or systems: Secondary | ICD-10-CM | POA: Diagnosis not present

## 2016-04-26 DIAGNOSIS — C3432 Malignant neoplasm of lower lobe, left bronchus or lung: Secondary | ICD-10-CM | POA: Diagnosis present

## 2016-04-26 DIAGNOSIS — R338 Other retention of urine: Secondary | ICD-10-CM | POA: Diagnosis present

## 2016-04-26 DIAGNOSIS — Z79891 Long term (current) use of opiate analgesic: Secondary | ICD-10-CM | POA: Diagnosis not present

## 2016-04-26 DIAGNOSIS — R339 Retention of urine, unspecified: Secondary | ICD-10-CM | POA: Diagnosis present

## 2016-04-26 DIAGNOSIS — Y92019 Unspecified place in single-family (private) house as the place of occurrence of the external cause: Secondary | ICD-10-CM | POA: Diagnosis not present

## 2016-04-26 DIAGNOSIS — M8458XA Pathological fracture in neoplastic disease, other specified site, initial encounter for fracture: Secondary | ICD-10-CM | POA: Diagnosis present

## 2016-04-26 DIAGNOSIS — C3492 Malignant neoplasm of unspecified part of left bronchus or lung: Secondary | ICD-10-CM

## 2016-04-26 DIAGNOSIS — Z9049 Acquired absence of other specified parts of digestive tract: Secondary | ICD-10-CM | POA: Diagnosis not present

## 2016-04-26 DIAGNOSIS — E119 Type 2 diabetes mellitus without complications: Secondary | ICD-10-CM | POA: Diagnosis present

## 2016-04-26 DIAGNOSIS — R330 Drug induced retention of urine: Secondary | ICD-10-CM | POA: Diagnosis present

## 2016-04-26 DIAGNOSIS — N179 Acute kidney failure, unspecified: Secondary | ICD-10-CM | POA: Diagnosis present

## 2016-04-26 DIAGNOSIS — E7800 Pure hypercholesterolemia, unspecified: Secondary | ICD-10-CM | POA: Diagnosis present

## 2016-04-26 DIAGNOSIS — Z833 Family history of diabetes mellitus: Secondary | ICD-10-CM | POA: Diagnosis not present

## 2016-04-26 DIAGNOSIS — T404X5A Adverse effect of other synthetic narcotics, initial encounter: Secondary | ICD-10-CM | POA: Diagnosis present

## 2016-04-26 DIAGNOSIS — M5136 Other intervertebral disc degeneration, lumbar region: Secondary | ICD-10-CM | POA: Diagnosis present

## 2016-04-26 DIAGNOSIS — K219 Gastro-esophageal reflux disease without esophagitis: Secondary | ICD-10-CM | POA: Diagnosis present

## 2016-04-26 DIAGNOSIS — C3400 Malignant neoplasm of unspecified main bronchus: Secondary | ICD-10-CM | POA: Diagnosis not present

## 2016-04-26 DIAGNOSIS — I10 Essential (primary) hypertension: Secondary | ICD-10-CM | POA: Diagnosis present

## 2016-04-26 DIAGNOSIS — Z8249 Family history of ischemic heart disease and other diseases of the circulatory system: Secondary | ICD-10-CM | POA: Diagnosis not present

## 2016-04-26 DIAGNOSIS — Z7984 Long term (current) use of oral hypoglycemic drugs: Secondary | ICD-10-CM | POA: Diagnosis not present

## 2016-04-26 DIAGNOSIS — Z960 Presence of urogenital implants: Secondary | ICD-10-CM | POA: Diagnosis not present

## 2016-04-26 DIAGNOSIS — F4323 Adjustment disorder with mixed anxiety and depressed mood: Secondary | ICD-10-CM | POA: Diagnosis present

## 2016-04-26 DIAGNOSIS — I2510 Atherosclerotic heart disease of native coronary artery without angina pectoris: Secondary | ICD-10-CM | POA: Diagnosis present

## 2016-04-26 DIAGNOSIS — N139 Obstructive and reflux uropathy, unspecified: Secondary | ICD-10-CM | POA: Diagnosis present

## 2016-04-26 HISTORY — DX: Malignant neoplasm of unspecified part of left bronchus or lung: C34.92

## 2016-04-26 LAB — GLUCOSE, CAPILLARY
GLUCOSE-CAPILLARY: 155 mg/dL — AB (ref 65–99)
GLUCOSE-CAPILLARY: 217 mg/dL — AB (ref 65–99)
Glucose-Capillary: 119 mg/dL — ABNORMAL HIGH (ref 65–99)

## 2016-04-26 LAB — URINE MICROSCOPIC-ADD ON

## 2016-04-26 LAB — COMPREHENSIVE METABOLIC PANEL
ALBUMIN: 3.2 g/dL — AB (ref 3.5–5.0)
ALK PHOS: 280 U/L — AB (ref 38–126)
ALT: 24 U/L (ref 14–54)
AST: 36 U/L (ref 15–41)
Anion gap: 15 (ref 5–15)
BILIRUBIN TOTAL: 0.5 mg/dL (ref 0.3–1.2)
BUN: 56 mg/dL — AB (ref 6–20)
CALCIUM: 8.5 mg/dL — AB (ref 8.9–10.3)
CO2: 20 mmol/L — AB (ref 22–32)
Chloride: 97 mmol/L — ABNORMAL LOW (ref 101–111)
Creatinine, Ser: 4.49 mg/dL — ABNORMAL HIGH (ref 0.44–1.00)
GFR calc Af Amer: 10 mL/min — ABNORMAL LOW (ref >60)
GFR calc non Af Amer: 8 mL/min — ABNORMAL LOW (ref >60)
GLUCOSE: 108 mg/dL — AB (ref 65–99)
Potassium: 4.5 mmol/L (ref 3.5–5.1)
Sodium: 132 mmol/L — ABNORMAL LOW (ref 135–145)
TOTAL PROTEIN: 6.2 g/dL — AB (ref 6.5–8.1)

## 2016-04-26 LAB — BASIC METABOLIC PANEL
ANION GAP: 10 (ref 5–15)
BUN: 54 mg/dL — AB (ref 6–20)
CHLORIDE: 101 mmol/L (ref 101–111)
CO2: 23 mmol/L (ref 22–32)
Calcium: 8.7 mg/dL — ABNORMAL LOW (ref 8.9–10.3)
Creatinine, Ser: 4.28 mg/dL — ABNORMAL HIGH (ref 0.44–1.00)
GFR calc Af Amer: 10 mL/min — ABNORMAL LOW (ref >60)
GFR calc non Af Amer: 9 mL/min — ABNORMAL LOW (ref >60)
Glucose, Bld: 144 mg/dL — ABNORMAL HIGH (ref 65–99)
POTASSIUM: 4.6 mmol/L (ref 3.5–5.1)
Sodium: 134 mmol/L — ABNORMAL LOW (ref 135–145)

## 2016-04-26 LAB — CBC WITH DIFFERENTIAL/PLATELET
BASOS PCT: 0 %
Basophils Absolute: 0 10*3/uL (ref 0.0–0.1)
Eosinophils Absolute: 0.1 10*3/uL (ref 0.0–0.7)
Eosinophils Relative: 1 %
HEMATOCRIT: 31.4 % — AB (ref 36.0–46.0)
Hemoglobin: 10.5 g/dL — ABNORMAL LOW (ref 12.0–15.0)
LYMPHS PCT: 19 %
Lymphs Abs: 2.6 10*3/uL (ref 0.7–4.0)
MCH: 27.3 pg (ref 26.0–34.0)
MCHC: 33.4 g/dL (ref 30.0–36.0)
MCV: 81.8 fL (ref 78.0–100.0)
MONO ABS: 1 10*3/uL (ref 0.1–1.0)
MONOS PCT: 8 %
NEUTROS ABS: 9.9 10*3/uL — AB (ref 1.7–7.7)
Neutrophils Relative %: 72 %
Platelets: 268 10*3/uL (ref 150–400)
RBC: 3.84 MIL/uL — ABNORMAL LOW (ref 3.87–5.11)
RDW: 15.1 % (ref 11.5–15.5)
WBC: 13.6 10*3/uL — ABNORMAL HIGH (ref 4.0–10.5)

## 2016-04-26 LAB — URINALYSIS, ROUTINE W REFLEX MICROSCOPIC
BILIRUBIN URINE: NEGATIVE
Glucose, UA: NEGATIVE mg/dL
KETONES UR: NEGATIVE mg/dL
NITRITE: NEGATIVE
PH: 5.5 (ref 5.0–8.0)
PROTEIN: NEGATIVE mg/dL
Specific Gravity, Urine: 1.01 (ref 1.005–1.030)

## 2016-04-26 LAB — CREATININE, URINE, RANDOM: CREATININE, URINE: 55.42 mg/dL

## 2016-04-26 LAB — SODIUM, URINE, RANDOM: SODIUM UR: 51 mmol/L

## 2016-04-26 MED ORDER — CARVEDILOL 12.5 MG PO TABS: 6.2500 mg | ORAL_TABLET | Freq: Two times a day (BID) | ORAL | Status: DC

## 2016-04-26 MED ORDER — VITAMIN B-12 1000 MCG PO TABS
1000.0000 ug | ORAL_TABLET | Freq: Every day | ORAL | Status: DC
  Administered 2016-04-26 – 2016-05-01 (×5): 1000 ug via ORAL
  Filled 2016-04-26 (×5): qty 1

## 2016-04-26 MED ORDER — CARVEDILOL 6.25 MG PO TABS
6.2500 mg | ORAL_TABLET | Freq: Two times a day (BID) | ORAL | Status: DC
  Administered 2016-04-26 – 2016-05-01 (×9): 6.25 mg via ORAL
  Filled 2016-04-26 (×9): qty 1

## 2016-04-26 MED ORDER — PANTOPRAZOLE SODIUM 40 MG PO TBEC
40.0000 mg | DELAYED_RELEASE_TABLET | Freq: Every day | ORAL | Status: DC
  Administered 2016-04-26 – 2016-05-01 (×5): 40 mg via ORAL
  Filled 2016-04-26 (×5): qty 1

## 2016-04-26 MED ORDER — SODIUM CHLORIDE 0.9 % IV SOLN
INTRAVENOUS | Status: AC
  Administered 2016-04-26: 16:00:00 via INTRAVENOUS

## 2016-04-26 MED ORDER — INSULIN ASPART 100 UNIT/ML ~~LOC~~ SOLN
0.0000 [IU] | Freq: Three times a day (TID) | SUBCUTANEOUS | Status: DC
  Administered 2016-04-26: 2 [IU] via SUBCUTANEOUS

## 2016-04-26 MED ORDER — GABAPENTIN 400 MG PO CAPS
400.0000 mg | ORAL_CAPSULE | Freq: Every day | ORAL | Status: DC
  Administered 2016-04-26 – 2016-04-30 (×5): 400 mg via ORAL
  Filled 2016-04-26 (×5): qty 1

## 2016-04-26 MED ORDER — HEPARIN SODIUM (PORCINE) 5000 UNIT/ML IJ SOLN: 5000.0000 [IU] | Freq: Three times a day (TID) | INTRAMUSCULAR | Status: DC

## 2016-04-26 MED ORDER — SODIUM CHLORIDE 0.9 % IV SOLN: INTRAVENOUS | Status: DC

## 2016-04-26 MED ORDER — GABAPENTIN 400 MG PO CAPS: 400.0000 mg | ORAL_CAPSULE | Freq: Every day | ORAL | Status: DC

## 2016-04-26 MED ORDER — BISACODYL 10 MG RE SUPP
10.0000 mg | Freq: Once | RECTAL | Status: DC
  Filled 2016-04-26: qty 1

## 2016-04-26 MED ORDER — HEPARIN SODIUM (PORCINE) 5000 UNIT/ML IJ SOLN
5000.0000 [IU] | Freq: Three times a day (TID) | INTRAMUSCULAR | Status: DC
  Administered 2016-04-26 – 2016-05-01 (×14): 5000 [IU] via SUBCUTANEOUS
  Filled 2016-04-26 (×11): qty 1

## 2016-04-26 MED ORDER — SODIUM CHLORIDE 0.9 % IV SOLN
510.0000 mg | Freq: Once | INTRAVENOUS | Status: AC
  Administered 2016-04-26: 510 mg via INTRAVENOUS
  Filled 2016-04-26 (×2): qty 17

## 2016-04-26 MED ORDER — FENTANYL CITRATE (PF) 100 MCG/2ML IJ SOLN
50.0000 ug | Freq: Once | INTRAMUSCULAR | Status: AC
  Administered 2016-04-26: 50 ug via INTRAVENOUS
  Filled 2016-04-26: qty 2

## 2016-04-26 MED ORDER — FENTANYL 12 MCG/HR TD PT72: 12.5000 ug | MEDICATED_PATCH | TRANSDERMAL | Status: DC

## 2016-04-26 MED ORDER — OXYCODONE HCL 5 MG PO TABS
5.0000 mg | ORAL_TABLET | ORAL | Status: DC | PRN
  Administered 2016-04-26 – 2016-05-01 (×11): 5 mg via ORAL
  Filled 2016-04-26 (×11): qty 1

## 2016-04-26 MED ORDER — ALPRAZOLAM 0.25 MG PO TABS: 1.0000 mg | ORAL_TABLET | Freq: Every evening | ORAL | Status: DC | PRN

## 2016-04-26 MED ORDER — INSULIN ASPART 100 UNIT/ML ~~LOC~~ SOLN: 0.0000 [IU] | Freq: Every day | SUBCUTANEOUS | Status: DC

## 2016-04-26 MED ORDER — VITAMIN B-12 1000 MCG PO TABS: 1000.0000 ug | ORAL_TABLET | Freq: Every day | ORAL | Status: DC

## 2016-04-26 MED ORDER — PANTOPRAZOLE SODIUM 40 MG PO TBEC: 40.0000 mg | DELAYED_RELEASE_TABLET | Freq: Every day | ORAL | Status: DC

## 2016-04-26 MED ORDER — POLYETHYLENE GLYCOL 3350 17 G PO PACK
17.0000 g | PACK | Freq: Every day | ORAL | Status: DC
  Filled 2016-04-26: qty 1

## 2016-04-26 MED ORDER — INSULIN ASPART 100 UNIT/ML ~~LOC~~ SOLN
0.0000 [IU] | Freq: Three times a day (TID) | SUBCUTANEOUS | Status: DC
  Administered 2016-04-26: 3 [IU] via SUBCUTANEOUS
  Administered 2016-04-27 (×2): 1 [IU] via SUBCUTANEOUS
  Administered 2016-04-27: 2 [IU] via SUBCUTANEOUS
  Administered 2016-04-28 (×2): 1 [IU] via SUBCUTANEOUS
  Administered 2016-04-28 – 2016-04-29 (×2): 2 [IU] via SUBCUTANEOUS
  Administered 2016-04-29: 1 [IU] via SUBCUTANEOUS
  Administered 2016-04-29 – 2016-04-30 (×3): 2 [IU] via SUBCUTANEOUS
  Administered 2016-05-01 (×2): 1 [IU] via SUBCUTANEOUS

## 2016-04-26 MED ORDER — ATORVASTATIN CALCIUM 40 MG PO TABS
40.0000 mg | ORAL_TABLET | Freq: Every day | ORAL | Status: DC
  Administered 2016-04-26 – 2016-04-30 (×5): 40 mg via ORAL
  Filled 2016-04-26 (×6): qty 1

## 2016-04-26 MED ORDER — FENTANYL 12 MCG/HR TD PT72
12.5000 ug | MEDICATED_PATCH | TRANSDERMAL | Status: DC
  Administered 2016-04-26 – 2016-04-29 (×2): 12.5 ug via TRANSDERMAL
  Filled 2016-04-26 (×2): qty 1

## 2016-04-26 MED ORDER — DOCUSATE SODIUM 100 MG PO CAPS
100.0000 mg | ORAL_CAPSULE | Freq: Two times a day (BID) | ORAL | Status: DC
  Administered 2016-04-26 – 2016-05-01 (×10): 100 mg via ORAL
  Filled 2016-04-26 (×10): qty 1

## 2016-04-26 MED ORDER — ATORVASTATIN CALCIUM 40 MG PO TABS: 40.0000 mg | ORAL_TABLET | Freq: Every day | ORAL | Status: DC

## 2016-04-26 MED ORDER — SODIUM CHLORIDE 0.9 % IV SOLN
INTRAVENOUS | Status: AC
  Administered 2016-04-26: 07:00:00 via INTRAVENOUS

## 2016-04-26 MED ORDER — POLYETHYLENE GLYCOL 3350 17 G PO PACK
17.0000 g | PACK | Freq: Every day | ORAL | Status: DC
  Administered 2016-04-26 – 2016-05-01 (×5): 17 g via ORAL
  Filled 2016-04-26 (×4): qty 1

## 2016-04-26 NOTE — ED Notes (Signed)
Admitting phy at bedside  

## 2016-04-26 NOTE — H&P (Signed)
Date: 04/26/2016               Patient Name:  Christina Estes MRN: [id number]  DOB: 23-May-1935 Age / Sex: 81 y.o., female   PCP: [name], MD         Medical Service: Internal Medicine Teaching Service         Attending Physician: Dr. Lucious Groves, DO    First Contact: Dr. Hetty Ely Pager: [phone]  Second Contact: Dr. Hulen Luster Pager: [phone]       After Hours (After 5p/  First Contact Pager: [phone]  weekends / holidays): Second Contact Pager: [phone]   Chief Complaint: diffuse abd pain, decrease urination, back pain  History of Present Illness:   81 yo female with known LLL 5 cm spiculated lung mass recently diagnosed and being followed by oncology outpatient, HTN, DM II, obesity, back pain on opioids, presented to [facility] with gradually increasing back pain, diffuse abd pain, and urinary retention. She was transferred to [facility] for MRI. She denies any dysuria, but does have a foul smell to her urine per patient. She feels she can void on her own but sometimes feels that she cannot empty her bladder fully. No hematuria. No vaginal discharge. Also has constipation, last BM 1 week ago. Has some mild diffuse abd pain per patient. No blood in the stool. Having on going back pain despite strong pain regimen (fentanyl, oxy IR, and sometimes also tramadol). Denies any weakness, numbness, tingling. Any fever/chills, weight loss, or any other complaint currently.  Patient was seen at the [place] center for her lung mass with possible Tspine pathology fx. Was given fentantyl patch + oxy IR. Had plan for palliative XRT consideration for her back pain. Was planned to have brain MRI for staging purpose.   BMET at ED showed crt 4.49, K 4.5, BUN 56. Rest of electrolytes are normal. Has WBC of 13.6. T spine and L spine MRI was done to rule out cord compression for her urinary retention which was negative for cord compression but showed widespread osseous mets to L and  T spine along with T4 and L2 verebral body pathologic fx.     Meds:  No outpatient prescriptions have been marked as taking for the 04/25/16 encounter [facility] Encounter).     Allergies: Allergies as of 04/25/2016  . (No Known Allergies)   Past Medical History:  Diagnosis Date  . Anxiety   . Coronary artery disease   . Depression   . Diabetes mellitus without complication ([place])   . DOE (dyspnea on exertion)   . GERD (gastroesophageal reflux disease)   . Hypercholesteremia   . Hypertension   . Lung mass 04/22/2016  . Obesity     Family History: father had heart disease, mother is healthy, brother had cancer at multiple sites from "agent [place]" at the [employer].   Social History: never smoked, denies alcohol or drug use. Lives with husband at [place], [place].   Review of Systems: Review of Systems  Constitutional: Negative for chills, fever and weight loss.  HENT: Negative for congestion.   Eyes: Negative for blurred vision and double vision.  Respiratory: Negative for cough, hemoptysis, shortness of breath and stridor.   Cardiovascular: Negative for chest pain, palpitations and leg swelling.  Gastrointestinal: Positive for constipation. Negative for blood in stool, melena, nausea and vomiting.  Genitourinary: Negative for dysuria, flank pain, frequency, hematuria and urgency.       Urine  smells foul  Skin: Negative for rash.  Neurological: Negative for dizziness, sensory change, seizures, loss of consciousness, weakness and headaches.     Physical Exam: Blood pressure 122/88, pulse 78, temperature 99.5 F (37.5 C), temperature source Rectal, resp. rate 18, height '5\' 8"'$  (1.727 m), weight 225 lb (102.1 kg), SpO2 100 %.  Physical Exam  Constitutional: She is oriented to person, place, and time. She appears well-developed and well-nourished.  HENT:  Head: Normocephalic and atraumatic.  Mouth/Throat: No oropharyngeal exudate.  Eyes: Conjunctivae are normal. Right eye exhibits  no discharge. Left eye exhibits no discharge.  Neck: Normal range of motion. No JVD present.  Cardiovascular: Normal rate and regular rhythm.  Exam reveals no gallop and no friction rub.   No murmur heard. Respiratory: Effort normal and breath sounds normal. No respiratory distress. She has no wheezes. She exhibits no tenderness.  GI: Soft. Bowel sounds are normal.  Obese abdomen. Mild tenderness diffusely. No mass. No rebound tenderness.   Musculoskeletal: Normal range of motion. She exhibits no edema.  No spinous process tenderness.  5/5 str bilaterally upper and lower exts. Lower ext ROM slightly limited due to back pain. Sensation intact.   Neurological: She is alert and oriented to person, place, and time. No cranial nerve deficit.  Skin: Skin is warm. No rash noted.  Psychiatric: She has a normal mood and affect.    EKG: none  CXR: none  MR THORACIC SPINE IMPRESSION  1. Findings consistent with widespread osseous metastases involving the thoracic spine. No significant extra osseous or epidural tumor at this time. No evidence for cord compression or severe canal stenosis. 2. Pathologic fracture involving the T4 vertebral body with associated 50% height loss without bony retropulsion.  MR LUMBAR SPINE IMPRESSION  1. Widespread osseous metastases involving the lumbar spine. 2. No evidence for cord compression. There is extraosseous extension of tumor at the right posterior aspect of the S2 segment, which could potentially affect the adjacent right sacral nerve roots. No other significant extraosseous extension of tumor. 3. Probable pathologic fracture involving L2 vertebral body with mild 10% height loss without bony retropulsion. 4. Multilevel degenerative spondylolysis and facet arthrosis as above, most prevalent at the L2-3 level were there is moderate canal stenosis. Patient is status post remote decompressive laminectomy at L3-4 and L4-5.    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  Acute urinary retention  Patient denies any significant urinary retention. However, had large output after foley was inserted. She also has AKI which is likely 2/2 to urinary retention. MRI L spine and Cspine was done with her hx of lung mass to rule out cord compression. It was negative for cord compression. Her urinary retention is likely multifactorial from constipation and also from her pain medications (fentantyl, oxycodone, tramadol).  Given her severe back pain from mets to her spine, it will be hard to discontinue the pain medications.  - continue foley catheter  - will do  voiding trial later once kidney function improves - may need to be discharged with foley, with outpatient urology follow up if she fails voiding trial.  AKI - likely 2/2 to urinary retention no baseline crt in chart, however, patient denies any hx of kidney problems. crt on admission 4.49. K+ is normal. - will get renal u/s. - monitor strict I/o - trend BMET - if this is due to urinary retention, I suspect this will improve slowly with foley - will defer further workup for AKI for now.  - avoid nephrotoxins. Hold ACEI  Lung mass on LLL suspected for lung cancer with widespread mets L and T spine with pathologic vertebral body fracture of T4 and L2. See full MRI report above. Saw March Rummage, PA-C at [place] center for this on 04/22/16.  Planned to have outpatient MRI brain for staging with plan for palliative XRT with Dr. Tammi Klippel to spine for pain control. Also planned for lung mass biopsy.  - will have her follow up with her outpatient oncologist for further management of this likely advanced lung cancer.  - cont pain meds: oxy IR '5mg'$  q4hr prn + fentanyl patch 12.58mg q72 hr.  Constipation - DG abdomen showed large stool last bm 1 week ago. Passing gas. Has some mild diffuse abd pain likely due to constipation. - will treat with dulcolax supp + colace + miralax.   HTN - cont coreg, hold lisinopril-hctz in the setting of AKI  DM II - unknown hgba1c.  - hold metformin. Will do SSI here.   Dispo: Admit patient to Inpatient with expected length of stay greater than 2 midnights.  Signed: TDellia Nims MD 04/26/2016, 8:39 AM  Pager: [phone]

## 2016-04-26 NOTE — ED Notes (Signed)
Report given to [facility] ED Charge Nurse "Gretta Cool".

## 2016-04-26 NOTE — Progress Notes (Signed)
Met with pt face to face.  Introduced myself and explain a little bit about my role as the patient navigator.  Pt given a card with all my information on it.  Told pt to call if they had any questions or concerns.  Pt verbalized understanding.

## 2016-04-26 NOTE — ED Provider Notes (Signed)
MSE was initiated and I personally evaluated the patient and placed orders (if any) at  6:40 AM on April 26, 2016.  The patient appears stable so that the remainder of the MSE may be completed by another provider.  NCAT MMM Neck is supple RRR CTAB NABS, soft FROM of B lower extremities   MRIs pending at this time   Adore Kithcart, MD 04/26/16 [phone]

## 2016-04-26 NOTE — ED Notes (Signed)
Report given to [place] RN

## 2016-04-26 NOTE — ED Notes (Signed)
Patient transported to Ultrasound 

## 2016-04-26 NOTE — ED Notes (Addendum)
Pt arrived to [facility] and report received. Pt in NAD at this time. Here for MRI d/t urinary retention and to be transferred back to [place]. MRI aware of pts arrival.

## 2016-04-26 NOTE — ED Notes (Signed)
Daughter Loren Racer [phone] would like a call when a decision is made as to disposition

## 2016-04-27 DIAGNOSIS — R918 Other nonspecific abnormal finding of lung field: Secondary | ICD-10-CM

## 2016-04-27 DIAGNOSIS — K5900 Constipation, unspecified: Secondary | ICD-10-CM

## 2016-04-27 DIAGNOSIS — E119 Type 2 diabetes mellitus without complications: Secondary | ICD-10-CM

## 2016-04-27 DIAGNOSIS — N179 Acute kidney failure, unspecified: Secondary | ICD-10-CM

## 2016-04-27 DIAGNOSIS — Z79891 Long term (current) use of opiate analgesic: Secondary | ICD-10-CM

## 2016-04-27 DIAGNOSIS — C7951 Secondary malignant neoplasm of bone: Secondary | ICD-10-CM

## 2016-04-27 DIAGNOSIS — R339 Retention of urine, unspecified: Secondary | ICD-10-CM

## 2016-04-27 DIAGNOSIS — I10 Essential (primary) hypertension: Secondary | ICD-10-CM

## 2016-04-27 DIAGNOSIS — C801 Malignant (primary) neoplasm, unspecified: Secondary | ICD-10-CM

## 2016-04-27 DIAGNOSIS — Z960 Presence of urogenital implants: Secondary | ICD-10-CM

## 2016-04-27 LAB — BASIC METABOLIC PANEL
Anion gap: 12 (ref 5–15)
Anion gap: 9 (ref 5–15)
BUN: 53 mg/dL — AB (ref 6–20)
BUN: 52 mg/dL — AB (ref 6–20)
CALCIUM: 9.3 mg/dL (ref 8.9–10.3)
CALCIUM: 9.8 mg/dL (ref 8.9–10.3)
CO2: 19 mmol/L — ABNORMAL LOW (ref 22–32)
CO2: 24 mmol/L (ref 22–32)
CREATININE: 3.47 mg/dL — AB (ref 0.44–1.00)
CREATININE: 3 mg/dL — AB (ref 0.44–1.00)
Chloride: 104 mmol/L (ref 101–111)
Chloride: 104 mmol/L (ref 101–111)
GFR calc Af Amer: 13 mL/min — ABNORMAL LOW (ref >60)
GFR calc non Af Amer: 14 mL/min — ABNORMAL LOW (ref >60)
GFR, EST AFRICAN AMERICAN: 16 mL/min — AB (ref >60)
GFR, EST NON AFRICAN AMERICAN: 12 mL/min — AB (ref >60)
GLUCOSE: 167 mg/dL — AB (ref 65–99)
Glucose, Bld: 151 mg/dL — ABNORMAL HIGH (ref 65–99)
Potassium: 4.9 mmol/L (ref 3.5–5.1)
Potassium: 5.5 mmol/L — ABNORMAL HIGH (ref 3.5–5.1)
SODIUM: 137 mmol/L (ref 135–145)
Sodium: 135 mmol/L (ref 135–145)

## 2016-04-27 LAB — GLUCOSE, CAPILLARY
GLUCOSE-CAPILLARY: 142 mg/dL — AB (ref 65–99)
GLUCOSE-CAPILLARY: 137 mg/dL — AB (ref 65–99)
Glucose-Capillary: 130 mg/dL — ABNORMAL HIGH (ref 65–99)
Glucose-Capillary: 144 mg/dL — ABNORMAL HIGH (ref 65–99)

## 2016-04-27 LAB — CBC
HCT: 31.9 % — ABNORMAL LOW (ref 36.0–46.0)
Hemoglobin: 10.4 g/dL — ABNORMAL LOW (ref 12.0–15.0)
MCH: 26.5 pg (ref 26.0–34.0)
MCHC: 32.6 g/dL (ref 30.0–36.0)
MCV: 81.4 fL (ref 78.0–100.0)
PLATELETS: 237 10*3/uL (ref 150–400)
RBC: 3.92 MIL/uL (ref 3.87–5.11)
RDW: 14.9 % (ref 11.5–15.5)
WBC: 9.8 10*3/uL (ref 4.0–10.5)

## 2016-04-27 LAB — URINE CULTURE: Culture: NO GROWTH

## 2016-04-27 MED ORDER — GLUCERNA SHAKE PO LIQD
237.0000 mL | Freq: Two times a day (BID) | ORAL | Status: DC
  Administered 2016-04-28 – 2016-05-01 (×4): 237 mL via ORAL

## 2016-04-27 NOTE — Evaluation (Signed)
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  Clinical Impression  Pt admitted with above diagnosis. Pt currently with functional limitations due to the deficits listed below (see PT Problem List). Christina Estes presents with weakness, decr balance (drifts into R lean with sitting and standing), all effecting her safety with mobility;  Pt will benefit from skilled PT to increase their independence and safety with mobility to allow discharge to the venue listed below.       Follow Up Recommendations SNF    Equipment Recommendations  3in1 (PT)    Recommendations for Other Services OT consult;Other (comment) (is it time to consider Palliative Care Team consult?)     Precautions / Restrictions Precautions Precautions: Fall Precaution Comments: Consider Back precautions for comfort Restrictions Weight Bearing Restrictions: No      Mobility  Bed Mobility Overal bed mobility: Needs Assistance Bed Mobility: Rolling;Sidelying to Sit Rolling: Min assist Sidelying to sit: Mod assist       General bed mobility comments: Heavy mod assist to elevate trunk to sit; effortful  Transfers Overall transfer level: Needs assistance Equipment used: Rolling walker (2 wheeled) Transfers: Sit to/from [facility] Sit to Stand: +2 safety/equipment;Mod assist Stand pivot transfers: +2 safety/equipment;Mod assist       General transfer comment: Moderate assist to power up; Noting prominent R lean while taking steps to recliner; needing multiple cues for hand placement and safety; decr control of descent to sit  Ambulation/Gait             General  Gait Details: performed pivot steps during transfer bed to chair  Stairs            Wheelchair Mobility    Modified Rankin (Stroke Patients Only)       Balance Overall balance assessment: Needs assistance Sitting-balance support: Bilateral upper extremity supported Sitting balance-Leahy Scale: Poor Sitting balance - Comments: Needing assist sitting EOB as pt has a R lean; reports she feels like she is falling Postural control: Right lateral lean Standing balance support: Bilateral upper extremity supported Standing balance-Leahy Scale: Poor Standing balance comment: R lean noted in standing                             Pertinent Vitals/Pain Pain Assessment: Faces Faces Pain Scale: Hurts little more Pain Location: Back pain; pt also states grimace is due to effort as well Pain Descriptors / Indicators: Aching;Discomfort;Grimacing Pain Intervention(s): Monitored during session;Repositioned    Home Living Family/patient expects to be discharged to:: Private residence Living Arrangements: Spouse/significant other Available Help at Discharge: Family;Available 24 hours/day Type of Home: House Home Access: Stairs to enter Entrance Stairs-Rails: Left Entrance Stairs-Number of Steps: 5 Home Layout: One level Home Equipment: Walker - 2 wheels      Prior Function Level of Independence: Needs assistance   Gait / Transfers Assistance Needed: with RW recently due to back pain  ADL's / Homemaking Assistance Needed: Husband assist        Hand Dominance        Extremity/Trunk Assessment   Upper Extremity Assessment: Generalized weakness           Lower  Extremity Assessment: Generalized weakness         Communication   Communication: No difficulties  Cognition Arousal/Alertness: Awake/alert Behavior During Therapy: WFL for tasks assessed/performed Overall Cognitive Status: Within Functional Limits for tasks assessed (for simple mobility tasks)                       General Comments      Exercises        Assessment/Plan    PT Assessment Patient needs continued PT services  PT Diagnosis Difficulty walking;Generalized weakness;Abnormality of gait   PT Problem List Decreased strength;Decreased activity tolerance;Decreased balance;Decreased mobility;Decreased coordination;Decreased knowledge of use of DME;Decreased safety awareness;Decreased knowledge of precautions;Pain  PT Treatment Interventions DME instruction;Gait training;Functional mobility training;Therapeutic activities;Therapeutic exercise;Balance training;Neuromuscular re-education;Cognitive remediation;Patient/family education   PT Goals (Current goals can be found in the Care Plan section) Acute Rehab PT Goals Patient Stated Goal: Did not state PT Goal Formulation: With patient Time For Goal Achievement: 05/11/16    Frequency Min 3X/week   Barriers to discharge        Co-evaluation               End of Session Equipment Utilized During Treatment: Gait belt Activity Tolerance: Patient tolerated treatment well Patient left: in chair;with call bell/phone within reach;with chair alarm set Nurse Communication: Mobility status         Time: 6825-7493 PT Time Calculation (min) (ACUTE ONLY): 24 min   Charges:   PT Evaluation $PT Eval Moderate Complexity: 1 Procedure PT Treatments $Therapeutic Activity: 8-22 mins   PT G Codes:        Christina Estes 04/27/2016, 11:04 AM  Christina Estes, PT  Acute Rehabilitation Services Pager [phone] Office [phone]

## 2016-04-27 NOTE — Discharge Summary (Signed)
Name: Christina Estes MRN: [id number] DOB: 12-30-1934 81 y.o. 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   atorvastatin 40 MG tablet Commonly known as:  LIPITOR Take 40 mg by mouth daily.   carvedilol 6.25 MG tablet Commonly known as:  COREG Take 6.25 mg by mouth 2 (two) times daily with a meal.   cyclobenzaprine 10 MG tablet Commonly known as:  FLEXERIL Take 10 mg by mouth 3 (three) times daily as needed for muscle spasms.   esomeprazole 40 MG capsule Commonly known as:  NEXIUM   fentaNYL 12 MCG/HR Commonly known as:  DURAGESIC - dosed mcg/hr Place 1 patch (12.5 mcg total) onto the skin every 3 (three) days.   gabapentin 400 MG capsule Commonly known as:  NEURONTIN Take 400 mg by mouth at bedtime.   losartan-hydrochlorothiazide 100-25 MG tablet Commonly known as:  HYZAAR Take 1 tablet by mouth daily.   metFORMIN 500 MG tablet Commonly known as:  GLUCOPHAGE Take 500 mg by mouth 2 (two) times daily with a meal.   oxyCODONE 5 MG immediate release tablet Commonly known as:  Oxy IR/ROXICODONE Take 1 tablet (5 mg total) by mouth every 4 (four) hours as needed for severe pain.   traMADol 50 MG tablet Commonly known as:  ULTRAM Take 50 mg by mouth every 6 (six) hours as needed for moderate pain.   vitamin B-12 1000 MCG tablet Commonly known as:   CYANOCOBALAMIN Take 1,000 mcg by mouth daily.       Disposition and follow-up:   Christina Estes was discharged from [facility] in Good condition.  At the hospital follow up visit please address:  1.  Urinary retention Please performed voiding trial once kidney function improves  2.  Labs / imaging needed at time of follow-up: CMET, kidney fxn   3.  Pending labs/ test needing follow-up: none  Follow-up Appointments: Follow-up Information    [facility]. Schedule an appointment as soon as possible for a visit on 05/06/2016.   Why:  You have an appointment scheduled at [facility] information: Aldine Brillion [phone]       Schedule an appointment as soon as possible for a visit today with KEFALAS,THOMAS, PA-C.   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 Lung cancer Christina Estes is a 81 year old female with a past medical history of hypertension, diabetes, obesity. She presented to the [facility] with complaints of increasing back pain, diffuse abdominal pain, and urinary retention. She was recently diagnosed with a 5 cm spiculated lung mass in her left lower lobe and has been following with oncology outpatient. MRI thoracic and lumbar spine showed widespread osseous metastasis. On 9/15 bronchoscopy was performed for lung mass biopsy and showed Stage IV non small cell carcinoma.  MRI brain showed extensive brain metastases.   Lumbar and thoracic metastasis Pain was controlled with fentanyl 12.5 MCG patch, gabapentin 400 mg at bedtime, and oxycodone immediate release 5 mg every 4 hours when necessary.  Urinary retention MRI images were reviewed with radiology there is no evidence of acute compression causing her urinary retention. Urinary retention symptoms are most likely secondary to constipation and narcotic medications taken for her back pain. She was discharged with a Foley catheter and scheduled to follow-up with urology.   Acute kidney injury On presentation she had creatinine 4.49 this gradually improved to 1.9 by the day of discharge. AKI was likely secondary to urinary obstruction. Baseline creatinine is unknown however she denies any history of kidney disease.  constipation Most likely secondary to home pain medication regiment as noted above. She was treated with one Dulcolax suppository, MiraLAX 17 g packets daily, Colace twice a day and one soapsuds enema.  Diabetes  Manage with CBG and sensitive ISS.  Essential hypertension Remained normotensive on home medication carvedilol 6.25 mg BID  Discharge Vitals:   BP (!) 123/48 (BP Location: Left Arm)   Pulse 66   Temp 97.3 F (36.3 C) (Oral)   Resp 20   Ht '5\' 8"'$  (1.727 m)   Wt 230 lb 9.6 oz (104.6 kg)   SpO2 97%   BMI 35.06 kg/m   Pertinent Labs, Studies, and Procedures:  Procedures Performed:  Dg Thoracic Spine 2 View  Result Date: 04/26/2016 CLINICAL DATA:  Back pain, abdominal pain, constipation. Recent diagnosis of lung cancer. EXAM: THORACIC SPINE 2 VIEWS COMPARISON:  None. FINDINGS: Mild compression deformity of upper thoracic vertebra, tentatively identified T4. Remaining thoracic vertebral body heights are maintained. Multilevel degenerative change with endplate spurring is most prominent in the mid and lower thoracic spine. Left perihilar opacity projects over the midthoracic spine.  IMPRESSION: Mild compression deformity of upper thoracic vertebra, tentatively identified at T4, acuity uncertain. Degenerative change in the mid lower thoracic spine. Electronically Signed   By: Jeb Levering M.D.   On: 04/26/2016 02:24   Dg Lumbar Spine Complete  Result Date: 04/26/2016 CLINICAL DATA:  Back pain, abdominal pain, constipation. Recent diagnosis of lung cancer. EXAM: LUMBAR SPINE - COMPLETE 4+ VIEW COMPARISON:  None. FINDINGS: Disc space narrowing most prominent at L5-S1. Lesser disc space narrowing at L3-L4 and L4-L5 with endplate spurring. Facet arthropathy from L3-L4 through the lumbosacral junction. Mild convex right scoliosis of the lower lumbar spine. No compression deformity or acute fracture. No gross evidence of bony destructive change. IMPRESSION: Degenerative disc disease and facet arthropathy in the lower lumbar spine. No radiographic evidence of acute osseous abnormality. Electronically Signed   By: Jeb Levering M.D.   On: 04/26/2016 02:22   Mr Jeri Cos OX Contrast  Result Date: 04/30/2016 CLINICAL DATA:  Lung cancer with spinal metastases. Evaluate for brain metastases. EXAM: MRI HEAD WITHOUT AND WITH CONTRAST TECHNIQUE: Multiplanar, multiecho pulse sequences of the brain  and surrounding structures were obtained without and with intravenous contrast. CONTRAST:  72m MULTIHANCE GADOBENATE DIMEGLUMINE 529 MG/ML IV SOLN COMPARISON:  None. FINDINGS: Brain: Essentially innumerable sub centimeter enhancing masses, mainly cortically based, numbering at least 80. Given the small size and occasionally subtle appearance, there are likely many more metastases which were not labeled on series 12. Most of these are T1 hyperintense on precontrast series, which is actually already enhanced based on mucosal appearance. The non cortical deposits included in the left pons, right caudate nucleus, and posterior left thalamus. Punctate focus of restricted diffusion in the high right frontal  cortical region is likely small infarct rather than blood products or dense cellularity which is not seen elsewhere. No hematoma, hydrocephalus, or vasogenic edema. Vascular: Normal flow voids. Skull and upper cervical spine: Known osseous metastatic disease which includes the skull. A large lesion in the right parietal bone likely erodes the inner table. This mass measures up to 19 mm. Sinuses/Orbits: No signs of metastatic disease in the orbits. Clear paranasal sinuses. Other: The patient is currently admitted. IMPRESSION: 1. Over 80 cerebral and infratentorial brain metastases. No vasogenic edema. 2. Tiny cortical infarct in the right frontal lobe. 3. 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 Electronically Signed   By: JMonte FantasiaM.D.   On: 04/30/2016 15:35   Mr Thoracic Spine Wo Contrast  Result Date: 04/26/2016 CLINICAL DATA:  Initial evaluation for gradually worsening urinary tension for 3-4 days. Also with increasing back pain for several weeks. Recently diagnosed with lung cancer. With known pathologic fracture. EXAM: MRI THORACIC AND LUMBAR SPINE WITHOUT CONTRAST TECHNIQUE: Multiplanar and multiecho pulse sequences of the thoracic and lumbar spine were obtained without intravenous contrast. COMPARISON:  None. FINDINGS: MRI THORACIC SPINE FINDINGS Alignment: Vertebral bodies normally aligned with preservation of the normal thoracic kyphosis. No listhesis. Vertebrae: Views of the cervical spine on the counter sequence demonstrate metastatic lesion within the C5 vertebral body. Mild multilevel degenerative changes without significant stenosis. Abnormal T1 hypo intense, mild stir hyperintense lesions seen throughout the thoracic spine, compatible with metastatic disease. Most notable lesion within the T4 vertebral body which is largely replaced by tumor. There is an associated pathologic fracture with approximately 50% height loss. No significant bony retropulsion.  Vertebral body heights otherwise maintained without additional pathologic fracture. Other prominent lesions present within the T1, T2, T6, T7, T8 comment T10, and L1 vertebral bodies. Probable small amount of extraosseous extension of tumor into the left T1-2 (series 5, image 13), T3-4 neural foramen (series 5, image 11) as well as T7-8 (series 5, image 10) no other significant extraosseous extension of tumor. Tumor noted within the right posterior eighth rib. Cord: Signal intensity within the thoracic spinal cord is normal. No significant epidural tumor. No evidence for cord compression. Paraspinal and other soft tissues: Paraspinous soft tissues demonstrate no acute abnormality. Partially visualized lungs are clear. Disc levels: Mild degenerative disc bulging noted at T1-2 and T12-L1 without stenosis. No other significant degenerative changes present within the thoracic spine. No significant canal stenosis. MRI LUMBAR SPINE FINDINGS Segmentation: Normal segmentation. Lowest well-formed disc labeled L5-S1. Alignment: Vertebral bodies normally aligned with preservation of the normal lumbar lordosis. No listhesis. Vertebrae: Multiple wall osseous metastases are seen involving the lumbar spine and visualized sacrum. The most prominent of these is at L2 over the right aspect of the L2 vertebral body is largely replaced by tumor. Mild height loss at the superior endplate of L2 of approximately 10% suspicious  for associated pathologic fracture. No bony retropulsion. Vertebral body heights otherwise maintained. Additional osseous metastasis present at nearly all levels, but most prominent at T12, L4 and the sacrum. There is extra osseous extension of tumor at the S2 segment, which could potentially affect the right sacral nerve roots at this level (series 15, image 6 6 on axial sagittal sequence, series 17, image 42 on axial sequence). No other significant extra osseous extension of tumor appreciated. Additional osseous  metastasis noted within the partially visualized bones of the pelvis. Conus medullaris: Extends to the T12-L1 level and appears normal. No significant epidural tumor within the lumbar spine. No evidence for cord compression. Paraspinal and other soft tissues: Paraspinous soft tissues demonstrate no acute abnormality. Remote postsurgical changes noted within the lower back. Few scattered T2 hyperintense cyst noted within the visualized kidneys. Disc levels: L1-2: Disc desiccation without significant disc bulge. No stenosis. L2-3: Mild diffuse disc bulge with disc desiccation. Bilateral facet arthrosis with ligamentum flavum hypertrophy. Resultant moderate canal stenosis. No significant foraminal narrowing. L3-4: Degenerative intervertebral disc space narrowing with disc desiccation and diffuse disc bulge. Patient is status post remote decompressive laminectomy. No residual stenosis. Left worse than right facet arthrosis. Mild bilateral foraminal stenosis. L4-5: Diffuse disc bulge with disc desiccation and intervertebral disc space narrowing. Remote decompressive laminectomy. Mild bile facet hypertrophy. No residual canal stenosis. Moderate left foraminal narrowing. L5-S1: Degenerative intervertebral disc space narrowing with disc desiccation and mild diffuse disc bulge. Mild facet arthrosis. No canal or foraminal stenosis. IMPRESSION: MR THORACIC SPINE IMPRESSION 1. Findings consistent with widespread osseous metastases involving the thoracic spine. No significant extra osseous or epidural tumor at this time. No evidence for cord compression or severe canal stenosis. 2. Pathologic fracture involving the T4 vertebral body with associated 50% height loss without bony retropulsion. MR LUMBAR SPINE IMPRESSION 1. Widespread osseous metastases involving the lumbar spine. 2. No evidence for cord compression. There is extraosseous extension of tumor at the right posterior aspect of the S2 segment, which could potentially  affect the adjacent right sacral nerve roots. No other significant extraosseous extension of tumor. 3. Probable pathologic fracture involving L2 vertebral body with mild 10% height loss without bony retropulsion. 4. Multilevel degenerative spondylolysis and facet arthrosis as above, most prevalent at the L2-3 level were there is moderate canal stenosis. Patient is status post remote decompressive laminectomy at L3-4 and L4-5. Please see above report for a full description of these findings. Electronically Signed   By: Jeannine Boga M.D.   On: 04/26/2016 07:16   Mr Lumbar Spine Wo Contrast  Result Date: 04/26/2016 CLINICAL DATA:  Initial evaluation for gradually worsening urinary tension for 3-4 days. Also with increasing back pain for several weeks. Recently diagnosed with lung cancer. With known pathologic fracture. EXAM: MRI THORACIC AND LUMBAR SPINE WITHOUT CONTRAST TECHNIQUE: Multiplanar and multiecho pulse sequences of the thoracic and lumbar spine were obtained without intravenous contrast. COMPARISON:  None. FINDINGS: MRI THORACIC SPINE FINDINGS Alignment: Vertebral bodies normally aligned with preservation of the normal thoracic kyphosis. No listhesis. Vertebrae: Views of the cervical spine on the counter sequence demonstrate metastatic lesion within the C5 vertebral body. Mild multilevel degenerative changes without significant stenosis. Abnormal T1 hypo intense, mild stir hyperintense lesions seen throughout the thoracic spine, compatible with metastatic disease. Most notable lesion within the T4 vertebral body which is largely replaced by tumor. There is an associated pathologic fracture with approximately 50% height loss. No significant bony retropulsion. Vertebral body heights otherwise maintained without additional  pathologic fracture. Other prominent lesions present within the T1, T2, T6, T7, T8 comment T10, and L1 vertebral bodies. Probable small amount of extraosseous extension of tumor  into the left T1-2 (series 5, image 13), T3-4 neural foramen (series 5, image 11) as well as T7-8 (series 5, image 10) no other significant extraosseous extension of tumor. Tumor noted within the right posterior eighth rib. Cord: Signal intensity within the thoracic spinal cord is normal. No significant epidural tumor. No evidence for cord compression. Paraspinal and other soft tissues: Paraspinous soft tissues demonstrate no acute abnormality. Partially visualized lungs are clear. Disc levels: Mild degenerative disc bulging noted at T1-2 and T12-L1 without stenosis. No other significant degenerative changes present within the thoracic spine. No significant canal stenosis. MRI LUMBAR SPINE FINDINGS Segmentation: Normal segmentation. Lowest well-formed disc labeled L5-S1. Alignment: Vertebral bodies normally aligned with preservation of the normal lumbar lordosis. No listhesis. Vertebrae: Multiple wall osseous metastases are seen involving the lumbar spine and visualized sacrum. The most prominent of these is at L2 over the right aspect of the L2 vertebral body is largely replaced by tumor. Mild height loss at the superior endplate of L2 of approximately 10% suspicious for associated pathologic fracture. No bony retropulsion. Vertebral body heights otherwise maintained. Additional osseous metastasis present at nearly all levels, but most prominent at T12, L4 and the sacrum. There is extra osseous extension of tumor at the S2 segment, which could potentially affect the right sacral nerve roots at this level (series 15, image 6 6 on axial sagittal sequence, series 17, image 42 on axial sequence). No other significant extra osseous extension of tumor appreciated. Additional osseous metastasis noted within the partially visualized bones of the pelvis. Conus medullaris: Extends to the T12-L1 level and appears normal. No significant epidural tumor within the lumbar spine. No evidence for cord compression. Paraspinal and  other soft tissues: Paraspinous soft tissues demonstrate no acute abnormality. Remote postsurgical changes noted within the lower back. Few scattered T2 hyperintense cyst noted within the visualized kidneys. Disc levels: L1-2: Disc desiccation without significant disc bulge. No stenosis. L2-3: Mild diffuse disc bulge with disc desiccation. Bilateral facet arthrosis with ligamentum flavum hypertrophy. Resultant moderate canal stenosis. No significant foraminal narrowing. L3-4: Degenerative intervertebral disc space narrowing with disc desiccation and diffuse disc bulge. Patient is status post remote decompressive laminectomy. No residual stenosis. Left worse than right facet arthrosis. Mild bilateral foraminal stenosis. L4-5: Diffuse disc bulge with disc desiccation and intervertebral disc space narrowing. Remote decompressive laminectomy. Mild bile facet hypertrophy. No residual canal stenosis. Moderate left foraminal narrowing. L5-S1: Degenerative intervertebral disc space narrowing with disc desiccation and mild diffuse disc bulge. Mild facet arthrosis. No canal or foraminal stenosis. IMPRESSION: MR THORACIC SPINE IMPRESSION 1. Findings consistent with widespread osseous metastases involving the thoracic spine. No significant extra osseous or epidural tumor at this time. No evidence for cord compression or severe canal stenosis. 2. Pathologic fracture involving the T4 vertebral body with associated 50% height loss without bony retropulsion. MR LUMBAR SPINE IMPRESSION 1. Widespread osseous metastases involving the lumbar spine. 2. No evidence for cord compression. There is extraosseous extension of tumor at the right posterior aspect of the S2 segment, which could potentially affect the adjacent right sacral nerve roots. No other significant extraosseous extension of tumor. 3. Probable pathologic fracture involving L2 vertebral body with mild 10% height loss without bony retropulsion. 4. Multilevel degenerative  spondylolysis and facet arthrosis as above, most prevalent at the L2-3 level were there is moderate  canal stenosis. Patient is status post remote decompressive laminectomy at L3-4 and L4-5. Please see above report for a full description of these findings. Electronically Signed   By: Jeannine Boga M.D.   On: 04/26/2016 07:16   US Renal  Result Date: 04/26/2016 CLINICAL DATA:  Urinary retention EXAM: RENAL / URINARY TRACT ULTRASOUND COMPLETE COMPARISON:  None. FINDINGS: Right Kidney: Length: 12.4 cm. Echogenicity within normal limits. No mass or hydronephrosis visualized. Left Kidney: Length: 12.0 cm. Echogenicity within normal limits. No mass or hydronephrosis visualized. Bladder: Decompressed with Foley catheter in place, not visualized IMPRESSION: No acute findings.  No hydronephrosis. Electronically Signed   By: Rolm Baptise M.D.   On: 04/26/2016 09:02   Dg Abdomen Acute W/chest  Result Date: 04/26/2016 CLINICAL DATA:  Abdominal pain and constipation. Back pain. Recent diagnosis of lung cancer. EXAM: DG ABDOMEN ACUTE W/ 1V CHEST COMPARISON:  None.  No prior exams for comparison. FINDINGS: There is cardiomegaly. Prominent left perihilar soft tissue. Left lung base opacity is nonspecific. No definite pleural fluid. Right lung is grossly clear. No free intra-abdominal air. No dilated bowel loops to suggest obstruction. There is increased stool in the distal sigmoid colon and rectum. No evidence of radiopaque calculi. No acute osseous abnormalities seen radiographically. IMPRESSION: 1. Increased stool in the distal sigmoid colon and rectum, suggesting constipation. No bowel obstruction. 2. Left perihilar and left lung base opacity. This is nonspecific. Recommend correlation with outside imaging given recent diagnosis of lung cancer. Electronically Signed   By: Jeb Levering M.D.   On: 04/26/2016 02:20   Dg C-arm Bronchoscopy  Result Date: 04/30/2016 CLINICAL DATA:  C-ARM BRONCHOSCOPY Fluoroscopy  was utilized by the requesting physician.  No radiographic interpretation.    Consultations: Treatment Team:  [name], MD   Discharge Instructions: Discharge Instructions    Ambulatory referral to Urology    Complete by:  As directed    Call MD for:  difficulty breathing, headache or visual disturbances    Complete by:  As directed    Call MD for:  extreme fatigue    Complete by:  As directed    Call MD for:  persistant dizziness or light-headedness    Complete by:  As directed    Call MD for:  severe uncontrolled pain    Complete by:  As directed    Call MD for:  severe uncontrolled pain    Complete by:  As directed    Call MD for:  temperature >100.4    Complete by:  As directed    Diet - low sodium heart healthy    Complete by:  As directed    Diet - low sodium heart healthy    Complete by:  As directed    Increase activity slowly    Complete by:  As directed    Increase activity slowly    Complete by:  As directed       Signed: Ledell Noss, MD 04/30/2016, 4:17 PM   Pager: [phone]

## 2016-04-27 NOTE — NC FL2 (Signed)
[place] LEVEL OF CARE SCREENING TOOL     IDENTIFICATION  Patient Name: Christina Estes Birthdate: Nov 18, 1934 Sex: female Admission Date (Current Location): 04/25/2016  [facility] and [place] Number:  Herbalist and Address:  [facility], [place] [address], [place], [place] [address]      Provider Number: [id number]  Attending Physician Name and Address:  Lucious Groves, DO  Relative Name and Phone Number:  Dejah, Droessler - [phone]     Current Level of Care: Hospital Recommended Level of Care: [place] Prior Approval Number:    Date Approved/Denied:   PASRR Number:  (Submitted for PASRR 9/12 - MUST QJ#[id number])  Discharge Plan: SNF    Current Diagnoses: Patient Active Problem List   Diagnosis Date Noted  . 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 Acute urinary retention 04/26/2016  . AKI (acute kidney injury) ([place]) 04/26/2016  . Essential hypertension 04/22/2016  . Diabetes ([place]) 04/22/2016  . Adjustment disorder with mixed anxiety and depressed mood 04/22/2016  . Stomach ulcer 04/22/2016  . Gall bladder disease 04/22/2016  . History of back surgery 04/22/2016  . GERD (gastroesophageal reflux disease) 04/22/2016  . Lung mass 04/22/2016    Orientation RESPIRATION BLADDER Height & Weight     Self, Time, Place  Normal Continent, Indwelling catheter (Catheter placed 9/10) Weight: 229 lb 15 oz (104.3 kg) Height:  '5\' 8"'$  (172.7 cm)  BEHAVIORAL SYMPTOMS/MOOD NEUROLOGICAL BOWEL NUTRITION STATUS      Continent Diet (Low sodium - Heart healthy)  AMBULATORY STATUS COMMUNICATION OF NEEDS Skin   Extensive Assist (Patient was unable to ambulate with PT on 9/12) Verbally Other (Comment) (Moisture associated skin damage to abdomen, breast and groin)                       Personal Care Assistance Level of Assistance  Bathing, Feeding, Dressing Bathing  Assistance: Maximum assistance Feeding assistance: Independent Dressing Assistance: Maximum assistance     Functional Limitations Info  Sight, Hearing, Speech Sight Info: Adequate Hearing Info: Adequate Speech Info: Adequate    SPECIAL CARE FACTORS FREQUENCY  PT (By licensed PT)     PT Frequency: Evaluated 9/12 and a minimum of 3X per week therapy recommended              Contractures Contractures Info: Not present    Additional Factors Info  Code Status, Allergies, Insulin Sliding Scale Code Status Info: Full Allergies Info: No known allergies   Insulin Sliding Scale Info: 0-5 Units daily at bedtime.  0-9 Units 3X per day with meals       Current Medications (04/27/2016):  This is the current hospital active medication list Current Facility-Administered Medications  Medication Dose Route Frequency Provider Last Rate Last Dose  . atorvastatin (LIPITOR) tablet 40 mg  40 mg Oral q1800 Lucious Groves, DO   40 mg at 04/27/16 1729  . bisacodyl (DULCOLAX) suppository 10 mg  10 mg Rectal Once Tasrif Ahmed, MD      . carvedilol (COREG) tablet 6.25 mg  6.25 mg Oral BID WC Lucious Groves, DO   6.25 mg at 04/27/16 1729  . docusate sodium (COLACE) capsule 100 mg  100 mg Oral BID Dellia Nims, MD   100 mg at 04/27/16 2130  . feeding supplement (GLUCERNA SHAKE) (GLUCERNA SHAKE) liquid 237 mL  237 mL Oral BID BM Lucious Groves, DO      .  fentaNYL (DURAGESIC - dosed mcg/hr) 12.5 mcg  12.5 mcg Transdermal Q72H Lucious Groves, DO   12.5 mcg at 04/26/16 1525  . gabapentin (NEURONTIN) capsule 400 mg  400 mg Oral QHS Lucious Groves, DO   400 mg at 04/27/16 2130  . heparin injection 5,000 Units  5,000 Units Subcutaneous Q8H Lucious Groves, DO   5,000 Units at 04/27/16 2131  . insulin aspart (novoLOG) injection 0-5 Units  0-5 Units Subcutaneous QHS Lucious Groves, DO      . insulin aspart (novoLOG) injection 0-9 Units  0-9 Units Subcutaneous TID WC Lucious Groves, DO   1 Units at 04/27/16 1729   . oxyCODONE (Oxy IR/ROXICODONE) immediate release tablet 5 mg  5 mg Oral Q4H PRN Dellia Nims, MD   5 mg at 04/27/16 2132  . pantoprazole (PROTONIX) EC tablet 40 mg  40 mg Oral Daily Lucious Groves, DO   40 mg at 04/27/16 4496  . polyethylene glycol (MIRALAX / GLYCOLAX) packet 17 g  17 g Oral Daily Lucious Groves, DO   17 g at 04/27/16 0834  . vitamin B-12 (CYANOCOBALAMIN) tablet 1,000 mcg  1,000 mcg Oral Daily Lucious Groves, DO   1,000 mcg at 04/27/16 7591     Discharge Medications: Please see discharge summary for a list of discharge medications.  Relevant Imaging Results:  Relevant Lab Results:   Additional Information ss#[id number]  Sable Feil, LCSW

## 2016-04-27 NOTE — Progress Notes (Signed)
Subjective: [name] is feeling well today and free of any new complaints. She denies back pain and has had relief of her feeling of bladder fullness. She reports no bowel movements overnight but feels that her abdominal pain has improved.  Objective:  Vital signs in last 24 hours: Vitals:   04/26/16 1806 04/26/16 2009 04/27/16 0525 04/27/16 0900  BP: (!) 106/51 135/67 (!) 140/50 114/61  Pulse: 76 83 75 71  Resp: '18 18 18 19  '$ Temp: 98.4 F (36.9 C) 98.5 F (36.9 C) 98.8 F (37.1 C) 98 F (36.7 C)  TempSrc: Oral Oral Oral Oral  SpO2: 95% 95% 94% 92%  Weight:  229 lb 8 oz (104.1 kg)    Height:       24-hour weight change: Weight change: 3 lb 14.4 oz (1.769 kg) Intake/Output:  09/11 0701 - 09/12 0700 In: 1504.5 [P.O.:1200; I.V.:187.5; IV Piggyback:117] Out: 1760 [Urine:1760]   Physical Exam  Constitutional: She appears well-developed and well-nourished. No distress.  Cardiovascular: Normal rate and regular rhythm.   No murmur heard. Pulmonary/Chest: She has no wheezes. She has no rales.  Abdominal: Soft. She exhibits no distension. There is no tenderness.   Labs: CBC:  Recent Labs Lab 04/26/16 0040 04/27/16 0431  WBC 13.6* 9.8  NEUTROABS 9.9*  --   HGB 10.5* 10.4*  HCT 31.4* 31.9*  MCV 81.8 81.4  PLT 081 448   Metabolic Panel:  Recent Labs Lab 04/26/16 0040 04/26/16 1913 04/27/16 0842  NA 132* 134* 135  K 4.5 4.6 4.9  CL 97* 101 104  CO2 20* 23 19*  GLUCOSE 108* 144* 167*  BUN 56* 54* 53*  CREATININE 4.49* 4.28* 3.47*  CALCIUM 8.5* 8.7* 9.3  ALT 24  --   --   ALKPHOS 280*  --   --   BILITOT 0.5  --   --   PROT 6.2*  --   --   ALBUMIN 3.2*  --   --    BG:  Recent Labs Lab 04/26/16 1439 04/26/16 1640 04/26/16 2004 04/27/16 0750 04/27/16 1143  GLUCAP 155* 217* 119* 142* 137*   No results found for: HGBA1C  Imaging: MRI thoracic spine 04/26/2016 1. Findings consistent with widespread osseous metastases involving the  thoracic spine. No significant extra osseous or epidural tumor at this time. No evidence for cord compression or severe canal stenosis. 2. Pathologic fracture involving the T4 vertebral body with associated 50% height loss without bony retropulsion.  MRI lumbar spine 04/26/2016 1. Widespread osseous metastases involving the lumbar spine. 2. No evidence for cord compression. There is extraosseous extension of tumor at the right posterior aspect of the S2 segment, which could potentially affect the adjacent right sacral nerve roots. No other significant extraosseous extension of tumor. 3. Probable pathologic fracture involving L2 vertebral body with mild 10% height loss without bony retropulsion. 4. Multilevel degenerative spondylolysis and facet arthrosis as above, most prevalent at the L2-3 level were there is moderate canal stenosis. Patient is status post remote decompressive laminectomy at L3-4 and L4-5. Please see above report for a full description of these findings.    Medications: Infusions:   Scheduled Medications: . atorvastatin  40 mg Oral q1800  . bisacodyl  10 mg Rectal Once  . carvedilol  6.25 mg Oral BID WC  . docusate sodium  100 mg Oral BID  . fentaNYL  12.5 mcg Transdermal Q72H  . gabapentin  400 mg Oral QHS  . heparin subcutaneous  5,000  Units Subcutaneous Q8H  . insulin aspart  0-5 Units Subcutaneous QHS  . insulin aspart  0-9 Units Subcutaneous TID WC  . pantoprazole  40 mg Oral Daily  . polyethylene glycol  17 g Oral Daily  . vitamin B-12  1,000 mcg Oral Daily   PRN Medications: oxyCODONE  Assessment/Plan: Pt is a 81 y.o. yo female with a PMHx of LLL 5 cm spiculated lung mass recently diagnosed, HTN, DM II, obesity, back pain on opioids who was admitted on 04/25/2016 with symptoms of increasing back pain, diffuse abd pain, and urinary retention, which was determined to be secondary to possible spinal metastisis.   Principal Problem:   Malignant neoplasm  metastatic to lumbar spine with unknown primary site [facility]) Active Problems:   Essential hypertension   Diabetes ([place])   Adjustment disorder with mixed anxiety and depressed mood   GERD (gastroesophageal reflux disease)   Lung mass   Acute urinary retention   AKI (acute kidney injury) ([place])  Acute urinary retention  Large output after foley was inserted. Creatinine is improving post foley catheter insertion, AKI is likely 2/2 to urinary retention. MRI L spine and Cspine was done with her hx of lung mass to rule out cord compression. This MRI was reviewed with radiology, it showed no extra osseous metastasis or central canal stenosis and no distal cord compression. The S2 compression is minimal and not likely related to this urinary retention. Her urinary retention is likely multifactorial from constipation and also from her pain medications (fentantyl, oxycodone, tramadol).  - continue foley catheter - will do voiding trial later once kidney function improves - she will be discharged with foley, with outpatient urology follow up if she fails voiding trial.  AKI -Creatine 3.47 shows improvement post Foley catheterization. no baseline crt in chart, however, patient denies any hx of kidney problems. crt on admission 4.49. Renal ultrasound showed no acute findings or hydronephrosis. - monitor strict I/o - trend BMET - will defer further workup for AKI for now.  - avoid nephrotoxins. Hold ACEI  Lung mass on LLL suspected for lung cancer with widespread mets L and T spine with pathologic vertebral body fracture of T4 and L2. Saw Christina Rummage, PA-C at [facility] for this on 04/22/16. Plan for palliative XRT with Christina [facility] to spine for pain control. Planned for lung mass biopsy 9/15 with [facility].  - will have her follow up with her outpatient oncologist for further management of this likely advanced lung cancer.  - cont pain meds: oxy IR '5mg'$  q4hr prn + fentanyl patch  12.14mg q72 hr.  Constipation - DG abdomen showed large stool last bm 1 week ago. Passing gas. Abdominal tenderness is improved on exam today.  - will treat with dulcolax supp + colace + miralax.   HTN - She remains normotensive. Cont coreg, hold lisinopril-hctz in the setting of AKI  DM II - unknown hgba1c.  - hold metformin. Will do CBG and sensitive SSI here.   Dispo: Anticipated discharge in approximately 1-2 day(s).   LOS: 1 day   NLedell Noss MD 04/27/2016, 11:57 AM Pager: [phone]

## 2016-04-27 NOTE — Consult Note (Signed)
Reason for Consult:Left lung mass Referring Physician: [name], PA, Christina Route, MD (primary)  Christina Estes is an 81 y.o. female.  HPI: 81 yo woman presents with a cc/o back pain and urinary retention.  Christina Estes is a 81 year old woman with a past medical history of hypertension, hyperlipidemia, obesity, gastroesophageal reflux, anxiety and depression. She was admitted to [facility] in August after presenting with dysphagia, indigestion, and back pain. EGD revealed ulcers. She was treated for that.  She followed up with Dr. Pleas Koch with a complaint of 30 pound weight loss and ongoing back pain. Further workup revealed a 5 cm spiculated mass in the left lower lobe and thoracic vertebral body compression deformity concerning for possible metastasis. She was seen in consultation by Dr. Larey Seat and Kirby Crigler at the [place] center. She was scheduled for an appointment to see me on 04/26/2016 to discuss bronchoscopy to biopsy the lung mass. She was admitted to [facility] on 04/25/2016 with complaints of back pain and urinary retention.  She continues to have back pain. She denies shortness of breath. She has been confused since being in the hospital (at least). Most of the history is provided by her family. An MRI of her spine showed extensive metastases.  Past Medical History:  Diagnosis Date  . Anxiety   . Coronary artery disease   . Depression   . Diabetes mellitus without complication ([place])   . DOE (dyspnea on exertion)   . GERD (gastroesophageal reflux disease)   . Hypercholesteremia   . Hypertension   . Lung mass 04/22/2016  . Obesity     Past Surgical History:  Procedure Laterality Date  . CARDIAC CATHETERIZATION  12/11/2003  . CHOLECYSTECTOMY      Family History  Problem Relation Age of Onset  . Cancer Mother   . Diabetes Mellitus II Son   . Cancer Brother     Social History:  reports that she has never smoked. She has never used smokeless  tobacco. She reports that she does not drink alcohol. Her drug history is not on file.  Allergies: No Known Allergies  Medications:  Prior to Admission:  Prescriptions Prior to Admission  Medication Sig Dispense Refill Last Dose  . ALPRAZolam (XANAX) 1 MG tablet Take 1 mg by mouth at bedtime as needed for anxiety.   04/24/2016  . atorvastatin (LIPITOR) 40 MG tablet Take 40 mg by mouth daily.   04/25/2016 at Unknown time  . carvedilol (COREG) 6.25 MG tablet Take 6.25 mg by mouth 2 (two) times daily with a meal.   04/25/2016 at am   . cyclobenzaprine (FLEXERIL) 10 MG tablet Take 10 mg by mouth 3 (three) times daily as needed for muscle spasms.    04/24/2016  . esomeprazole (NEXIUM) 40 MG capsule    04/25/2016 at Unknown time  . fentaNYL (DURAGESIC - DOSED MCG/HR) 12 MCG/HR Place 1 patch (12.5 mcg total) onto the skin every 3 (three) days. 10 patch 0 04/25/2016 at Unknown time  . gabapentin (NEURONTIN) 400 MG capsule Take 400 mg by mouth at bedtime.   04/24/2016  . losartan-hydrochlorothiazide (HYZAAR) 100-25 MG tablet Take 1 tablet by mouth daily.   04/25/2016 at Unknown time  . metFORMIN (GLUCOPHAGE) 500 MG tablet Take 500 mg by mouth 2 (two) times daily with a meal.    04/25/2016 at am  . oxyCODONE (OXY IR/ROXICODONE) 5 MG immediate release tablet Take 1 tablet (5 mg total) by mouth every 4 (four) hours as needed for  severe pain. 60 tablet 0 04/24/2016  . traMADol (ULTRAM) 50 MG tablet Take 50 mg by mouth every 6 (six) hours as needed for moderate pain.    PRN  . vitamin B-12 (CYANOCOBALAMIN) 1000 MCG tablet Take 1,000 mcg by mouth daily.   04/25/2016 at Unknown time    Results for orders placed or performed during the hospital encounter of 04/25/16 (from the past 48 hour(s))  Urinalysis, Routine w reflex microscopic (not at [facility])     Status: Abnormal   Collection Time: 04/25/16 11:19 PM  Result Value Ref Range   Color, Urine YELLOW YELLOW   APPearance CLOUDY (A) CLEAR   Specific Gravity, Urine >1.030  (H) 1.005 - 1.030   pH 5.5 5.0 - 8.0   Glucose, UA NEGATIVE NEGATIVE mg/dL   Hgb urine dipstick NEGATIVE NEGATIVE   Bilirubin Urine NEGATIVE NEGATIVE   Ketones, ur TRACE (A) NEGATIVE mg/dL   Protein, ur NEGATIVE NEGATIVE mg/dL   Nitrite NEGATIVE NEGATIVE   Leukocytes, UA NEGATIVE NEGATIVE    Comment: MICROSCOPIC NOT DONE ON URINES WITH NEGATIVE PROTEIN, BLOOD, LEUKOCYTES, NITRITE, OR GLUCOSE <1000 mg/dL.  Urine culture     Status: None   Collection Time: 04/25/16 11:19 PM  Result Value Ref Range   Specimen Description URINE, CLEAN CATCH    Special Requests NONE    Culture NO GROWTH Performed at [facility]     Report Status 04/27/2016 FINAL   CBC with Differential     Status: Abnormal   Collection Time: 04/26/16 12:40 AM  Result Value Ref Range   WBC 13.6 (H) 4.0 - 10.5 K/uL   RBC 3.84 (L) 3.87 - 5.11 MIL/uL   Hemoglobin 10.5 (L) 12.0 - 15.0 g/dL   HCT 31.4 (L) 36.0 - 46.0 %   MCV 81.8 78.0 - 100.0 fL   MCH 27.3 26.0 - 34.0 pg   MCHC 33.4 30.0 - 36.0 g/dL   RDW 15.1 11.5 - 15.5 %   Platelets 268 150 - 400 K/uL   Neutrophils Relative % 72 %   Neutro Abs 9.9 (H) 1.7 - 7.7 K/uL   Lymphocytes Relative 19 %   Lymphs Abs 2.6 0.7 - 4.0 K/uL   Monocytes Relative 8 %   Monocytes Absolute 1.0 0.1 - 1.0 K/uL   Eosinophils Relative 1 %   Eosinophils Absolute 0.1 0.0 - 0.7 K/uL   Basophils Relative 0 %   Basophils Absolute 0.0 0.0 - 0.1 K/uL  Comprehensive metabolic panel     Status: Abnormal   Collection Time: 04/26/16 12:40 AM  Result Value Ref Range   Sodium 132 (L) 135 - 145 mmol/L   Potassium 4.5 3.5 - 5.1 mmol/L   Chloride 97 (L) 101 - 111 mmol/L   CO2 20 (L) 22 - 32 mmol/L   Glucose, Bld 108 (H) 65 - 99 mg/dL   BUN 56 (H) 6 - 20 mg/dL   Creatinine, Ser 4.49 (H) 0.44 - 1.00 mg/dL   Calcium 8.5 (L) 8.9 - 10.3 mg/dL   Total Protein 6.2 (L) 6.5 - 8.1 g/dL   Albumin 3.2 (L) 3.5 - 5.0 g/dL   AST 36 15 - 41 U/L   ALT 24 14 - 54 U/L   Alkaline Phosphatase 280 (H) 38  - 126 U/L   Total Bilirubin 0.5 0.3 - 1.2 mg/dL   GFR calc non Af Amer 8 (L) >60 mL/min   GFR calc Af Amer 10 (L) >60 mL/min    Comment: (NOTE) The eGFR  has been calculated using the CKD EPI equation. This calculation has not been validated in all clinical situations. eGFR's persistently <60 mL/min signify possible Chronic Kidney Disease.    Anion gap 15 5 - 15  Glucose, capillary     Status: Abnormal   Collection Time: 04/26/16  2:39 PM  Result Value Ref Range   Glucose-Capillary 155 (H) 65 - 99 mg/dL  Sodium, urine, random     Status: None   Collection Time: 04/26/16  3:04 PM  Result Value Ref Range   Sodium, Ur 51 mmol/L  Creatinine, urine, random     Status: None   Collection Time: 04/26/16  3:04 PM  Result Value Ref Range   Creatinine, Urine 55.42 mg/dL  Urinalysis, Routine w reflex microscopic (not at [facility])     Status: Abnormal   Collection Time: 04/26/16  3:04 PM  Result Value Ref Range   Color, Urine YELLOW YELLOW   APPearance CLOUDY (A) CLEAR   Specific Gravity, Urine 1.010 1.005 - 1.030   pH 5.5 5.0 - 8.0   Glucose, UA NEGATIVE NEGATIVE mg/dL   Hgb urine dipstick MODERATE (A) NEGATIVE   Bilirubin Urine NEGATIVE NEGATIVE   Ketones, ur NEGATIVE NEGATIVE mg/dL   Protein, ur NEGATIVE NEGATIVE mg/dL   Nitrite NEGATIVE NEGATIVE   Leukocytes, UA TRACE (A) NEGATIVE  Urine microscopic-add on     Status: Abnormal   Collection Time: 04/26/16  3:04 PM  Result Value Ref Range   Squamous Epithelial / LPF 0-5 (A) NONE SEEN   WBC, UA 6-30 0 - 5 WBC/hpf   RBC / HPF 0-5 0 - 5 RBC/hpf   Bacteria, UA RARE (A) NONE SEEN   Casts HYALINE CASTS (A) NEGATIVE   Urine-Other MUCOUS PRESENT   Glucose, capillary     Status: Abnormal   Collection Time: 04/26/16  4:40 PM  Result Value Ref Range   Glucose-Capillary 217 (H) 65 - 99 mg/dL  Basic metabolic panel     Status: Abnormal   Collection Time: 04/26/16  7:13 PM  Result Value Ref Range   Sodium 134 (L) 135 - 145 mmol/L   Potassium  4.6 3.5 - 5.1 mmol/L   Chloride 101 101 - 111 mmol/L   CO2 23 22 - 32 mmol/L   Glucose, Bld 144 (H) 65 - 99 mg/dL   BUN 54 (H) 6 - 20 mg/dL   Creatinine, Ser 4.28 (H) 0.44 - 1.00 mg/dL   Calcium 8.7 (L) 8.9 - 10.3 mg/dL   GFR calc non Af Amer 9 (L) >60 mL/min   GFR calc Af Amer 10 (L) >60 mL/min    Comment: (NOTE) The eGFR has been calculated using the CKD EPI equation. This calculation has not been validated in all clinical situations. eGFR's persistently <60 mL/min signify possible Chronic Kidney Disease.    Anion gap 10 5 - 15  Glucose, capillary     Status: Abnormal   Collection Time: 04/26/16  8:04 PM  Result Value Ref Range   Glucose-Capillary 119 (H) 65 - 99 mg/dL  CBC     Status: Abnormal   Collection Time: 04/27/16  4:31 AM  Result Value Ref Range   WBC 9.8 4.0 - 10.5 K/uL   RBC 3.92 3.87 - 5.11 MIL/uL   Hemoglobin 10.4 (L) 12.0 - 15.0 g/dL   HCT 31.9 (L) 36.0 - 46.0 %   MCV 81.4 78.0 - 100.0 fL   MCH 26.5 26.0 - 34.0 pg   MCHC 32.6 30.0 - 36.0  g/dL   RDW 14.9 11.5 - 15.5 %   Platelets 237 150 - 400 K/uL  Glucose, capillary     Status: Abnormal   Collection Time: 04/27/16  7:50 AM  Result Value Ref Range   Glucose-Capillary 142 (H) 65 - 99 mg/dL  Basic metabolic panel     Status: Abnormal   Collection Time: 04/27/16  8:42 AM  Result Value Ref Range   Sodium 135 135 - 145 mmol/L   Potassium 4.9 3.5 - 5.1 mmol/L   Chloride 104 101 - 111 mmol/L   CO2 19 (L) 22 - 32 mmol/L   Glucose, Bld 167 (H) 65 - 99 mg/dL   BUN 53 (H) 6 - 20 mg/dL   Creatinine, Ser 3.47 (H) 0.44 - 1.00 mg/dL   Calcium 9.3 8.9 - 10.3 mg/dL   GFR calc non Af Amer 12 (L) >60 mL/min   GFR calc Af Amer 13 (L) >60 mL/min    Comment: (NOTE) The eGFR has been calculated using the CKD EPI equation. This calculation has not been validated in all clinical situations. eGFR's persistently <60 mL/min signify possible Chronic Kidney Disease.    Anion gap 12 5 - 15  Glucose, capillary     Status:  Abnormal   Collection Time: 04/27/16 11:43 AM  Result Value Ref Range   Glucose-Capillary 137 (H) 65 - 99 mg/dL    Dg Thoracic Spine 2 View  Result Date: 04/26/2016 CLINICAL DATA:  Back pain, abdominal pain, constipation. Recent diagnosis of lung cancer. EXAM: THORACIC SPINE 2 VIEWS COMPARISON:  None. FINDINGS: Mild compression deformity of upper thoracic vertebra, tentatively identified T4. Remaining thoracic vertebral body heights are maintained. Multilevel degenerative change with endplate spurring is most prominent in the mid and lower thoracic spine. Left perihilar opacity projects over the midthoracic spine. IMPRESSION: Mild compression deformity of upper thoracic vertebra, tentatively identified at T4, acuity uncertain. Degenerative change in the mid lower thoracic spine. Electronically Signed   By: Jeb Levering M.D.   On: 04/26/2016 02:24   Dg Lumbar Spine Complete  Result Date: 04/26/2016 CLINICAL DATA:  Back pain, abdominal pain, constipation. Recent diagnosis of lung cancer. EXAM: LUMBAR SPINE - COMPLETE 4+ VIEW COMPARISON:  None. FINDINGS: Disc space narrowing most prominent at L5-S1. Lesser disc space narrowing at L3-L4 and L4-L5 with endplate spurring. Facet arthropathy from L3-L4 through the lumbosacral junction. Mild convex right scoliosis of the lower lumbar spine. No compression deformity or acute fracture. No gross evidence of bony destructive change. IMPRESSION: Degenerative disc disease and facet arthropathy in the lower lumbar spine. No radiographic evidence of acute osseous abnormality. Electronically Signed   By: Jeb Levering M.D.   On: 04/26/2016 02:22   Mr Thoracic Spine Wo Contrast  Result Date: 04/26/2016 CLINICAL DATA:  Initial evaluation for gradually worsening urinary tension for 3-4 days. Also with increasing back pain for several weeks. Recently diagnosed with lung cancer. With known pathologic fracture. EXAM: MRI THORACIC AND LUMBAR SPINE WITHOUT CONTRAST  TECHNIQUE: Multiplanar and multiecho pulse sequences of the thoracic and lumbar spine were obtained without intravenous contrast. COMPARISON:  None. FINDINGS: MRI THORACIC SPINE FINDINGS Alignment: Vertebral bodies normally aligned with preservation of the normal thoracic kyphosis. No listhesis. Vertebrae: Views of the cervical spine on the counter sequence demonstrate metastatic lesion within the C5 vertebral body. Mild multilevel degenerative changes without significant stenosis. Abnormal T1 hypo intense, mild stir hyperintense lesions seen throughout the thoracic spine, compatible with metastatic disease. Most notable lesion within the T4 vertebral  body which is largely replaced by tumor. There is an associated pathologic fracture with approximately 50% height loss. No significant bony retropulsion. Vertebral body heights otherwise maintained without additional pathologic fracture. Other prominent lesions present within the T1, T2, T6, T7, T8 comment T10, and L1 vertebral bodies. Probable small amount of extraosseous extension of tumor into the left T1-2 (series 5, image 13), T3-4 neural foramen (series 5, image 11) as well as T7-8 (series 5, image 10) no other significant extraosseous extension of tumor. Tumor noted within the right posterior eighth rib. Cord: Signal intensity within the thoracic spinal cord is normal. No significant epidural tumor. No evidence for cord compression. Paraspinal and other soft tissues: Paraspinous soft tissues demonstrate no acute abnormality. Partially visualized lungs are clear. Disc levels: Mild degenerative disc bulging noted at T1-2 and T12-L1 without stenosis. No other significant degenerative changes present within the thoracic spine. No significant canal stenosis. MRI LUMBAR SPINE FINDINGS Segmentation: Normal segmentation. Lowest well-formed disc labeled L5-S1. Alignment: Vertebral bodies normally aligned with preservation of the normal lumbar lordosis. No listhesis.  Vertebrae: Multiple wall osseous metastases are seen involving the lumbar spine and visualized sacrum. The most prominent of these is at L2 over the right aspect of the L2 vertebral body is largely replaced by tumor. Mild height loss at the superior endplate of L2 of approximately 10% suspicious for associated pathologic fracture. No bony retropulsion. Vertebral body heights otherwise maintained. Additional osseous metastasis present at nearly all levels, but most prominent at T12, L4 and the sacrum. There is extra osseous extension of tumor at the S2 segment, which could potentially affect the right sacral nerve roots at this level (series 15, image 6 6 on axial sagittal sequence, series 17, image 42 on axial sequence). No other significant extra osseous extension of tumor appreciated. Additional osseous metastasis noted within the partially visualized bones of the pelvis. Conus medullaris: Extends to the T12-L1 level and appears normal. No significant epidural tumor within the lumbar spine. No evidence for cord compression. Paraspinal and other soft tissues: Paraspinous soft tissues demonstrate no acute abnormality. Remote postsurgical changes noted within the lower back. Few scattered T2 hyperintense cyst noted within the visualized kidneys. Disc levels: L1-2: Disc desiccation without significant disc bulge. No stenosis. L2-3: Mild diffuse disc bulge with disc desiccation. Bilateral facet arthrosis with ligamentum flavum hypertrophy. Resultant moderate canal stenosis. No significant foraminal narrowing. L3-4: Degenerative intervertebral disc space narrowing with disc desiccation and diffuse disc bulge. Patient is status post remote decompressive laminectomy. No residual stenosis. Left worse than right facet arthrosis. Mild bilateral foraminal stenosis. L4-5: Diffuse disc bulge with disc desiccation and intervertebral disc space narrowing. Remote decompressive laminectomy. Mild bile facet hypertrophy. No residual  canal stenosis. Moderate left foraminal narrowing. L5-S1: Degenerative intervertebral disc space narrowing with disc desiccation and mild diffuse disc bulge. Mild facet arthrosis. No canal or foraminal stenosis. IMPRESSION: MR THORACIC SPINE IMPRESSION 1. Findings consistent with widespread osseous metastases involving the thoracic spine. No significant extra osseous or epidural tumor at this time. No evidence for cord compression or severe canal stenosis. 2. Pathologic fracture involving the T4 vertebral body with associated 50% height loss without bony retropulsion. MR LUMBAR SPINE IMPRESSION 1. Widespread osseous metastases involving the lumbar spine. 2. No evidence for cord compression. There is extraosseous extension of tumor at the right posterior aspect of the S2 segment, which could potentially affect the adjacent right sacral nerve roots. No other significant extraosseous extension of tumor. 3. Probable pathologic fracture involving L2 vertebral  body with mild 10% height loss without bony retropulsion. 4. Multilevel degenerative spondylolysis and facet arthrosis as above, most prevalent at the L2-3 level were there is moderate canal stenosis. Patient is status post remote decompressive laminectomy at L3-4 and L4-5. Please see above report for a full description of these findings. Electronically Signed   By: Jeannine Boga M.D.   On: 04/26/2016 07:16   Mr Lumbar Spine Wo Contrast  Result Date: 04/26/2016 CLINICAL DATA:  Initial evaluation for gradually worsening urinary tension for 3-4 days. Also with increasing back pain for several weeks. Recently diagnosed with lung cancer. With known pathologic fracture. EXAM: MRI THORACIC AND LUMBAR SPINE WITHOUT CONTRAST TECHNIQUE: Multiplanar and multiecho pulse sequences of the thoracic and lumbar spine were obtained without intravenous contrast. COMPARISON:  None. FINDINGS: MRI THORACIC SPINE FINDINGS Alignment: Vertebral bodies normally aligned with  preservation of the normal thoracic kyphosis. No listhesis. Vertebrae: Views of the cervical spine on the counter sequence demonstrate metastatic lesion within the C5 vertebral body. Mild multilevel degenerative changes without significant stenosis. Abnormal T1 hypo intense, mild stir hyperintense lesions seen throughout the thoracic spine, compatible with metastatic disease. Most notable lesion within the T4 vertebral body which is largely replaced by tumor. There is an associated pathologic fracture with approximately 50% height loss. No significant bony retropulsion. Vertebral body heights otherwise maintained without additional pathologic fracture. Other prominent lesions present within the T1, T2, T6, T7, T8 comment T10, and L1 vertebral bodies. Probable small amount of extraosseous extension of tumor into the left T1-2 (series 5, image 13), T3-4 neural foramen (series 5, image 11) as well as T7-8 (series 5, image 10) no other significant extraosseous extension of tumor. Tumor noted within the right posterior eighth rib. Cord: Signal intensity within the thoracic spinal cord is normal. No significant epidural tumor. No evidence for cord compression. Paraspinal and other soft tissues: Paraspinous soft tissues demonstrate no acute abnormality. Partially visualized lungs are clear. Disc levels: Mild degenerative disc bulging noted at T1-2 and T12-L1 without stenosis. No other significant degenerative changes present within the thoracic spine. No significant canal stenosis. MRI LUMBAR SPINE FINDINGS Segmentation: Normal segmentation. Lowest well-formed disc labeled L5-S1. Alignment: Vertebral bodies normally aligned with preservation of the normal lumbar lordosis. No listhesis. Vertebrae: Multiple wall osseous metastases are seen involving the lumbar spine and visualized sacrum. The most prominent of these is at L2 over the right aspect of the L2 vertebral body is largely replaced by tumor. Mild height loss at the  superior endplate of L2 of approximately 10% suspicious for associated pathologic fracture. No bony retropulsion. Vertebral body heights otherwise maintained. Additional osseous metastasis present at nearly all levels, but most prominent at T12, L4 and the sacrum. There is extra osseous extension of tumor at the S2 segment, which could potentially affect the right sacral nerve roots at this level (series 15, image 6 6 on axial sagittal sequence, series 17, image 42 on axial sequence). No other significant extra osseous extension of tumor appreciated. Additional osseous metastasis noted within the partially visualized bones of the pelvis. Conus medullaris: Extends to the T12-L1 level and appears normal. No significant epidural tumor within the lumbar spine. No evidence for cord compression. Paraspinal and other soft tissues: Paraspinous soft tissues demonstrate no acute abnormality. Remote postsurgical changes noted within the lower back. Few scattered T2 hyperintense cyst noted within the visualized kidneys. Disc levels: L1-2: Disc desiccation without significant disc bulge. No stenosis. L2-3: Mild diffuse disc bulge with disc desiccation. Bilateral facet  arthrosis with ligamentum flavum hypertrophy. Resultant moderate canal stenosis. No significant foraminal narrowing. L3-4: Degenerative intervertebral disc space narrowing with disc desiccation and diffuse disc bulge. Patient is status post remote decompressive laminectomy. No residual stenosis. Left worse than right facet arthrosis. Mild bilateral foraminal stenosis. L4-5: Diffuse disc bulge with disc desiccation and intervertebral disc space narrowing. Remote decompressive laminectomy. Mild bile facet hypertrophy. No residual canal stenosis. Moderate left foraminal narrowing. L5-S1: Degenerative intervertebral disc space narrowing with disc desiccation and mild diffuse disc bulge. Mild facet arthrosis. No canal or foraminal stenosis. IMPRESSION: MR THORACIC SPINE  IMPRESSION 1. Findings consistent with widespread osseous metastases involving the thoracic spine. No significant extra osseous or epidural tumor at this time. No evidence for cord compression or severe canal stenosis. 2. Pathologic fracture involving the T4 vertebral body with associated 50% height loss without bony retropulsion. MR LUMBAR SPINE IMPRESSION 1. Widespread osseous metastases involving the lumbar spine. 2. No evidence for cord compression. There is extraosseous extension of tumor at the right posterior aspect of the S2 segment, which could potentially affect the adjacent right sacral nerve roots. No other significant extraosseous extension of tumor. 3. Probable pathologic fracture involving L2 vertebral body with mild 10% height loss without bony retropulsion. 4. Multilevel degenerative spondylolysis and facet arthrosis as above, most prevalent at the L2-3 level were there is moderate canal stenosis. Patient is status post remote decompressive laminectomy at L3-4 and L4-5. Please see above report for a full description of these findings. Electronically Signed   By: Jeannine Boga M.D.   On: 04/26/2016 07:16   US Renal  Result Date: 04/26/2016 CLINICAL DATA:  Urinary retention EXAM: RENAL / URINARY TRACT ULTRASOUND COMPLETE COMPARISON:  None. FINDINGS: Right Kidney: Length: 12.4 cm. Echogenicity within normal limits. No mass or hydronephrosis visualized. Left Kidney: Length: 12.0 cm. Echogenicity within normal limits. No mass or hydronephrosis visualized. Bladder: Decompressed with Foley catheter in place, not visualized IMPRESSION: No acute findings.  No hydronephrosis. Electronically Signed   By: Rolm Baptise M.D.   On: 04/26/2016 09:02   Dg Abdomen Acute W/chest  Result Date: 04/26/2016 CLINICAL DATA:  Abdominal pain and constipation. Back pain. Recent diagnosis of lung cancer. EXAM: DG ABDOMEN ACUTE W/ 1V CHEST COMPARISON:  None.  No prior exams for comparison. FINDINGS: There is  cardiomegaly. Prominent left perihilar soft tissue. Left lung base opacity is nonspecific. No definite pleural fluid. Right lung is grossly clear. No free intra-abdominal air. No dilated bowel loops to suggest obstruction. There is increased stool in the distal sigmoid colon and rectum. No evidence of radiopaque calculi. No acute osseous abnormalities seen radiographically. IMPRESSION: 1. Increased stool in the distal sigmoid colon and rectum, suggesting constipation. No bowel obstruction. 2. Left perihilar and left lung base opacity. This is nonspecific. Recommend correlation with outside imaging given recent diagnosis of lung cancer. Electronically Signed   By: Jeb Levering M.D.   On: 04/26/2016 02:20    Review of Systems  Constitutional: Positive for malaise/fatigue. Negative for chills and fever.  Eyes: Positive for blurred vision. Negative for double vision.  Respiratory: Positive for shortness of breath.   Cardiovascular: Negative for chest pain.  Gastrointestinal: Positive for abdominal pain and heartburn.  Genitourinary:       Urinary retention  Musculoskeletal: Positive for back pain.  Neurological: Positive for weakness. Negative for seizures, loss of consciousness and headaches.  Psychiatric/Behavioral: Positive for depression.   Blood pressure 114/61, pulse 71, temperature 98 F (36.7 C), temperature source Oral, resp.  rate 19, height 5' 8"  (1.727 m), weight 229 lb 8 oz (104.1 kg), SpO2 92 %. Physical Exam  Vitals reviewed. Constitutional:  obese  HENT:  Head: Normocephalic and atraumatic.  Eyes: Conjunctivae and EOM are normal. No scleral icterus.  Neck: Neck supple.  Cardiovascular: Normal rate, regular rhythm and normal heart sounds.   Respiratory: Effort normal and breath sounds normal. No respiratory distress. She has no wheezes.  GI: Soft. She exhibits no distension. There is no tenderness.  Lymphadenopathy:    She has no cervical adenopathy.  Neurological: She is  alert. No cranial nerve deficit.  confused  Skin: Skin is warm and dry.    Assessment/Plan: 81 year old woman who is a lifelong nonsmoker who presents with back pain and a 30 pound weight loss over 3 months. She has a left lung mass and multiple lytic thoracic spine metastases. This is most consistent with stage IV lung cancer. She needs a soft tissue biopsy in preference to a bone biopsy due to the need for molecular testing since she is a lifelong nonsmoker.  I recommended to the patient and her family that she undergo bronchoscopy for biopsy. They understand that this is a diagnostic procedure and not therapeutic in any way. They understand that no guarantee can be given a definitive biopsy. We will plan to do this in the operating room under general anesthesia so that we could do extensive sampling in hopes having enough tissue for molecular testing. They understand the indications, risks, benefits, and alternatives. They understand the risks include those associated with general anesthesia such as MI, stroke, DVT, PE. There are also procedure specific risks such as bleeding, pneumothorax, and failure to make a diagnosis.  Unfortunately the schedule will not allow me to do bronchoscopy tomorrow. I do have an opening on Friday. If she is ready to be discharged prior to that the procedure could be done on an outpatient basis.  [name] 04/27/2016, 3:09 PM

## 2016-04-27 NOTE — Progress Notes (Signed)
Initial Nutrition Assessment  DOCUMENTATION CODES:   Obesity unspecified  INTERVENTION:  Provide Glucerna Shake po BID, each supplement provides 220 kcal and 10 grams of protein.  Encourage adequate PO intake.   NUTRITION DIAGNOSIS:   Increased nutrient needs related to chronic illness as evidenced by estimated needs.  GOAL:   Patient will meet greater than or equal to 90% of their needs  MONITOR:   PO intake, Supplement acceptance, Labs, Weight trends, Skin, I & O's  REASON FOR ASSESSMENT:   Malnutrition Screening Tool    ASSESSMENT:   81 yo F with known lung mass suspicious for lung cancer presents with urinary retention, AKI, and increased back pain found to have widespread osseous metastatic process to L spine and Tspine without any cord compression.  Pt reports appetite is fine currently, however does reports appetite has been more varied at home prior to admission but still consumes 3 meals a day. Meal completion since admission has been 50-100%. Husband at bedside reports pt with weight loss of 30 lbs since July 2017, however per Epic weight records weight has been stable. RD to order nutritional supplements to aid in caloric and protein needs. Husband and wife educated on the importance of adequate nutrition.   Pt with no observed significant fat or muscle mass loss.   Labs and medications reviewed.   Diet Order:  Diet Heart Room service appropriate? Yes; Fluid consistency: Thin Diet - low sodium heart healthy  Skin:  Reviewed, no issues  Last BM:  Unknown  Height:   Ht Readings from Last 1 Encounters:  04/26/16 '5\' 8"'$  (1.727 m)    Weight:   Wt Readings from Last 1 Encounters:  04/26/16 229 lb 8 oz (104.1 kg)    Ideal Body Weight:  63.6 kg  BMI:  Body mass index is 34.9 kg/m.  Estimated Nutritional Needs:   Kcal:  3142-7670  Protein:  100-110 grams  Fluid:  1.8 - 2 L/day  EDUCATION NEEDS:   Education needs addressed  Corrin Parker,  MS, RD, LDN Pager # [phone] After hours/ weekend pager # [phone]

## 2016-04-27 NOTE — Care Management Note (Signed)
Case Management Note  Patient Details  Name: Christina Estes MRN: [id number] Date of Birth: [birth year]  Subjective/Objective:   CM following for progression and d/c planning.                  Action/Plan: 04/27/2016 Met with pt and husband re pt plan to d/c to SNF . List of local SNF given to pt and husband , and insurance accepted by each discussed. Both pt and husband currently hope that pt will be able to go to [place]. CSW Crawford Givens informed .    Expected Discharge Date:                  Expected Discharge Plan:  [place]  In-House Referral:  Clinical Social Work  Discharge planning Services  NA  Post Acute Care Choice:  NA Choice offered to:  NA  DME Arranged:    DME Agency:     HH Arranged:    HH Agency:     Status of Service:  In process, will continue to follow  If discussed at Long Length of Stay Meetings, dates discussed:    Additional Comments:  Adron Bene, RN 04/27/2016, 2:54 PM

## 2016-04-28 DIAGNOSIS — C3432 Malignant neoplasm of lower lobe, left bronchus or lung: Principal | ICD-10-CM

## 2016-04-28 LAB — BASIC METABOLIC PANEL
ANION GAP: 12 (ref 5–15)
ANION GAP: 13 (ref 5–15)
BUN: 50 mg/dL — ABNORMAL HIGH (ref 6–20)
BUN: 48 mg/dL — ABNORMAL HIGH (ref 6–20)
CALCIUM: 9.7 mg/dL (ref 8.9–10.3)
CHLORIDE: 104 mmol/L (ref 101–111)
CHLORIDE: 102 mmol/L (ref 101–111)
CO2: 22 mmol/L (ref 22–32)
CO2: 22 mmol/L (ref 22–32)
CREATININE: 2.31 mg/dL — AB (ref 0.44–1.00)
Calcium: 9.8 mg/dL (ref 8.9–10.3)
Creatinine, Ser: 2.61 mg/dL — ABNORMAL HIGH (ref 0.44–1.00)
GFR calc Af Amer: 19 mL/min — ABNORMAL LOW (ref >60)
GFR calc non Af Amer: 16 mL/min — ABNORMAL LOW (ref >60)
GFR calc non Af Amer: 19 mL/min — ABNORMAL LOW (ref >60)
GFR, EST AFRICAN AMERICAN: 22 mL/min — AB (ref >60)
GLUCOSE: 141 mg/dL — AB (ref 65–99)
Glucose, Bld: 126 mg/dL — ABNORMAL HIGH (ref 65–99)
POTASSIUM: 4.1 mmol/L (ref 3.5–5.1)
Potassium: 4.9 mmol/L (ref 3.5–5.1)
Sodium: 138 mmol/L (ref 135–145)
Sodium: 137 mmol/L (ref 135–145)

## 2016-04-28 LAB — GLUCOSE, CAPILLARY
GLUCOSE-CAPILLARY: 141 mg/dL — AB (ref 65–99)
GLUCOSE-CAPILLARY: 195 mg/dL — AB (ref 65–99)
GLUCOSE-CAPILLARY: 190 mg/dL — AB (ref 65–99)
Glucose-Capillary: 135 mg/dL — ABNORMAL HIGH (ref 65–99)

## 2016-04-28 MED ORDER — ENSURE ENLIVE PO LIQD: 237.0000 mL | Freq: Two times a day (BID) | ORAL | Status: DC

## 2016-04-28 NOTE — Progress Notes (Addendum)
Subjective: [name] is feeling well today. Her back pain is well controlled with this current regiment. She reports no bowel movements yesterday however she denies abdominal pain. Her son and husband were at her bedside this morning during rounds they were updated on the plan for SNF placement at a facility close to [facility].   Objective:  Vital signs in last 24 hours: Vitals:   04/27/16 0900 04/27/16 1900 04/27/16 2128 04/28/16 0614  BP: 114/61 (!) 151/80 (!) 143/70 135/64  Pulse: 71 74 75 71  Resp: '19 19 18 18  '$ Temp: 98 F (36.7 C) 98.4 F (36.9 C) 98 F (36.7 C) 98.2 F (36.8 C)  TempSrc: Oral Oral Oral Oral  SpO2: 92% 95% 95% 95%  Weight:   229 lb 15 oz (104.3 kg)   Height:       24-hour weight change: Weight change: 1 lb 0.6 oz (0.472 kg) Intake/Output:  09/12 0701 - 09/13 0700 In: 800 [P.O.:800] Out: 2620 [Urine:2620]   Physical Exam  Constitutional: She is oriented to person, place, and time. She appears well-developed and well-nourished. No distress.  Cardiovascular: Normal rate and regular rhythm.   No murmur heard. Pulmonary/Chest: She has no wheezes. She has no rales.  Abdominal: Soft. Bowel sounds are normal. She exhibits no distension. There is no tenderness. There is no guarding.  Neurological: She is alert and oriented to person, place, and time.  Extremities: no calf tenderness, no peripheral edema   Labs: CBC:  Recent Labs Lab 04/26/16 0040 04/27/16 0431  WBC 13.6* 9.8  NEUTROABS 9.9*  --   HGB 10.5* 10.4*  HCT 31.4* 31.9*  MCV 81.8 81.4  PLT 903 009   Metabolic Panel:  Recent Labs Lab 04/26/16 0040 04/26/16 1913 04/27/16 0842 04/27/16 1824  NA 132* 134* 135 137  K 4.5 4.6 4.9 5.5*  CL 97* 101 104 104  CO2 20* 23 19* 24  GLUCOSE 108* 144* 167* 151*  BUN 56* 54* 53* 52*  CREATININE 4.49* 4.28* 3.47* 3.00*  CALCIUM 8.5* 8.7* 9.3 9.8  ALT 24  --   --   --   ALKPHOS 280*  --   --   --   BILITOT 0.5  --   --   --   PROT  6.2*  --   --   --   ALBUMIN 3.2*  --   --   --    Cardiac Labs: No results for input(s): CKTOTAL, CKMB, CKMBINDEX, TROPIPOC, TROPONINI, BNP in the last 168 hours. BG:  Recent Labs Lab 04/27/16 0750 04/27/16 1143 04/27/16 1643 04/27/16 2120 04/28/16 0734  GLUCAP 142* 137* 130* 144* 135*   No results found for: HGBA1C  Microbiology: Urine culture drawn 9/10 No growth   Imaging: MRI thoracic spine 04/26/2016 1. Findings consistent with widespread osseous metastases involving the thoracic spine. No significant extra osseous or epidural tumor at this time. No evidence for cord compression or severe canal stenosis. 2. Pathologic fracture involving the T4 vertebral body with associated 50% height loss without bony retropulsion.  MRI lumbar spine 04/26/2016 1. Widespread osseous metastases involving the lumbar spine. 2. No evidence for cord compression. There is extraosseous extension of tumor at the right posterior aspect of the S2 segment, which could potentially affect the adjacent right sacral nerve roots. No other significant extraosseous extension of tumor. 3. Probable pathologic fracture involving L2 vertebral body with mild 10% height loss without bony retropulsion. 4. Multilevel degenerative spondylolysis and facet arthrosis as  above, most prevalent at the L2-3 level were there is moderate canal stenosis. Patient is status post remote decompressive laminectomy at L3-4 and L4-5. Please see above report for a full description of these findings.    Medications: Infusions:   Scheduled Medications: . atorvastatin  40 mg Oral q1800  . bisacodyl  10 mg Rectal Once  . carvedilol  6.25 mg Oral BID WC  . docusate sodium  100 mg Oral BID  . feeding supplement (GLUCERNA SHAKE)  237 mL Oral BID BM  . fentaNYL  12.5 mcg Transdermal Q72H  . gabapentin  400 mg Oral QHS  . heparin subcutaneous  5,000 Units Subcutaneous Q8H  . insulin aspart  0-5 Units Subcutaneous QHS  .  insulin aspart  0-9 Units Subcutaneous TID WC  . pantoprazole  40 mg Oral Daily  . polyethylene glycol  17 g Oral Daily  . vitamin B-12  1,000 mcg Oral Daily   PRN Medications: oxyCODONE  Assessment/Plan: Pt is a 81 y.o. yo female with a PMHx of LLL 5 cm spiculated lung mass recently diagnosed, HTN, DM II, obesity, back pain on opioids who was admitted on 04/25/2016 with symptoms of increasing back pain, diffuse abd pain, and urinary retention, which was determined to be secondary to spinal metastisis.   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 Creatinine is improving post foley catheter insertion, AKI is likely 2/2 to urinary retention. MRI L spine and C spine was done with her hx of lung mass to rule out cord compression. This MRI was reviewed with radiology, it showed no extra osseous metastasis or central canal stenosis and no distal cord compression. The S2 compression is minimal and not likely related to this urinary retention. Her urinary retention is likely multifactorial from constipation and pain medications (fentantyl, oxycodone, tramadol).  - continue foley catheter - will do voiding trial once kidney function improves - she will be discharged with foley, with outpatient urology follow up if she fails voiding trial  AKI-Creatine 2.61 shows improvement post Foley catheterization. no baseline crt in chart however patient denies any hx of kidney problems. crt on admission 4.49. Renal ultrasound showed no acute findings or hydronephrosis. - monitor strict I/o - trend BMET - avoid nephrotoxins. Hold ACEI  Lung mass on LLL  suspected for lung cancer with widespread mets L and T spine with pathologic vertebral body fracture of T4 and L2. Saw March Rummage, PA-C at [facility] for this on 04/22/16. Plan for palliative XRT with Dr. Tammi Klippel to spine for pain control.  -Planned for lung mass biopsy 9/15 with Dr. Roxan Hockey.  - will have her follow up with her outpatient oncologist for further management of this likely advanced lung cancer.  - cont pain meds: oxy IR '5mg'$  q4hr prn + fentanyl patch 12.42mg q72 hr.  Constipation- DG abdomen showed large stool Denies abdominal pain, last bm 1 week ago. Passing gas. Abdominal tenderness is improved on exam today.  - will treat with dulcolax supp + colace + miralax.   HTN- She remains normotensive. Cont coreg, hold lisinopril-hctz in the setting of AKI  DM II -unknown hgba1c.  -  hold metformin. Will do CBG and sensitive SSI here.   Dispo: Anticipated discharge in approximately 2-4 day(s).   LOS: 2 days   Ledell Noss, MD 04/28/2016, 9:02 AM Pager: [phone]

## 2016-04-28 NOTE — Progress Notes (Signed)
Internal Medicine Attending:   I saw and examined the patient. I reviewed the resident's note and I agree with the resident's findings and plan as documented in the resident's note. She will need SNF placement, appreciate social work assistance.  It would be ideal to keep her here until Friday to obtain tissue diagnosis of her suspected malignancy with Dr Roxan Hockey versus transferring her to SNF on thursday and then right back on Friday for bronchoscopy.   Otherwise her abdominal pain is resolved and SCr is trending down.  Her back pain is well controlled.  I appreciate pharmacy's concern about her gabapentin but with her rapid improvement in Scr I would continue her current does, she is not sedated on exam today.

## 2016-04-28 NOTE — Progress Notes (Signed)
      [address]       [place] [address]             [phone]       Mrs. Mazzeo feels much better today. Abdominal pain resolved, back pain "not too bad"  BP 120/70 (BP Location: Left Arm)   Pulse 74   Temp 98.5 F (36.9 C)   Resp 18   Ht '5\' 8"'$  (1.727 m)   Wt 229 lb 15 oz (104.3 kg)   SpO2 96%   BMI 34.96 kg/m   For bronch on Friday morning.  Should be able to discharge Friday afternoon after procedure  Remo Lipps C. Roxan Hockey, MD [facility] [phone]

## 2016-04-29 DIAGNOSIS — T402X5A Adverse effect of other opioids, initial encounter: Secondary | ICD-10-CM

## 2016-04-29 DIAGNOSIS — K5903 Drug induced constipation: Secondary | ICD-10-CM

## 2016-04-29 DIAGNOSIS — Z79899 Other long term (current) drug therapy: Secondary | ICD-10-CM

## 2016-04-29 LAB — GLUCOSE, CAPILLARY
GLUCOSE-CAPILLARY: 129 mg/dL — AB (ref 65–99)
GLUCOSE-CAPILLARY: 176 mg/dL — AB (ref 65–99)
Glucose-Capillary: 162 mg/dL — ABNORMAL HIGH (ref 65–99)
Glucose-Capillary: 196 mg/dL — ABNORMAL HIGH (ref 65–99)

## 2016-04-29 LAB — BASIC METABOLIC PANEL
ANION GAP: 11 (ref 5–15)
BUN: 45 mg/dL — ABNORMAL HIGH (ref 6–20)
CHLORIDE: 104 mmol/L (ref 101–111)
CO2: 23 mmol/L (ref 22–32)
Calcium: 9.7 mg/dL (ref 8.9–10.3)
Creatinine, Ser: 1.9 mg/dL — ABNORMAL HIGH (ref 0.44–1.00)
GFR calc non Af Amer: 24 mL/min — ABNORMAL LOW (ref >60)
GFR, EST AFRICAN AMERICAN: 28 mL/min — AB (ref >60)
Glucose, Bld: 214 mg/dL — ABNORMAL HIGH (ref 65–99)
Potassium: 4 mmol/L (ref 3.5–5.1)
Sodium: 138 mmol/L (ref 135–145)

## 2016-04-29 NOTE — Clinical Social Work Placement (Signed)
   CLINICAL SOCIAL WORK PLACEMENT  NOTE  Date:  04/29/2016  Patient Details  Name: Christina Estes MRN: [id number] Date of Birth: 1934/12/25  Clinical Social Work is seeking post-discharge placement for this patient at the [place] level of care (*CSW will initial, date and re-position this form in  chart as items are completed):  Yes   Patient/family provided with [place] Work Department's list of facilities offering this level of care within the geographic area requested by the patient (or if unable, by the patient's family).  Yes   Patient/family informed of their freedom to choose among providers that offer the needed level of care, that participate in Medicare, Medicaid or managed care program needed by the patient, have an available bed and are willing to accept the patient.  Yes   Patient/family informed of [place]'s ownership interest in [facility] and [facility], as well as of the fact that they are under no obligation to receive care at these facilities.  PASRR submitted to EDS on 04/28/16     PASRR number received on  (under review had to submit 30-day note, H&P and FL2)     Existing PASRR number confirmed on       FL2 transmitted to all facilities in geographic area requested by pt/family on 04/29/16     FL2 transmitted to all facilities within larger geographic area on       Patient informed that his/her managed care company has contracts with or will negotiate with certain facilities, including the following:        Yes   Patient/family informed of bed offers received.  Patient chooses bed at [facility]     Physician recommends and patient chooses bed at      Patient to be transferred to [facility] on  .  Patient to be transferred to facility by       Patient family notified on   of transfer.  Name of family member notified:        PHYSICIAN       Additional Comment:     _______________________________________________ Dulcy Fanny, LCSW 04/29/2016, 1:37 PM

## 2016-04-29 NOTE — Care Management Important Message (Signed)
Important Message  Patient Details  Name: Christina Estes MRN: [id number] Date of Birth: [birth year]   Medicare Important Message Given:  Yes    [name] Abena 04/29/2016, 11:20 AM

## 2016-04-29 NOTE — Progress Notes (Signed)
Subjective: [name] is experiencing some abdominal fullness today. She has been passing gas but has not had a bowel movement in over a week. Her back pain is well controlled on her current regiment. She denies any new concerns or complaints.  Objective:  Vital signs in last 24 hours: Vitals:   04/28/16 1828 04/28/16 2109 04/29/16 0524 04/29/16 0859  BP: (!) 116/59 (!) 139/43 (!) 132/48 (!) 138/53  Pulse: 77 65 64 66  Resp: '18 18 18 18  '$ Temp: 98.3 F (36.8 C) 98.2 F (36.8 C) 98.5 F (36.9 C) 98.1 F (36.7 C)  TempSrc: Oral Oral Oral Oral  SpO2: 96% 95% 95% 96%  Weight:  230 lb 9.6 oz (104.6 kg)    Height:       Physical Exam  Constitutional: She appears well-developed and well-nourished. No distress.  Cardiovascular: Normal rate and regular rhythm.   No murmur heard. Pulmonary/Chest: She has no wheezes. She has no rales.  Abdominal: Bowel sounds are normal. She exhibits distension. There is no tenderness. There is no guarding.  Neurological: She is alert.  Extremities no calf tenderness  Labs: CBC:  Recent Labs Lab 04/26/16 0040 04/27/16 0431  WBC 13.6* 9.8  NEUTROABS 9.9*  --   HGB 10.5* 10.4*  HCT 31.4* 31.9*  MCV 81.8 81.4  PLT 423 536   Metabolic Panel:  Recent Labs Lab 04/26/16 0040 04/26/16 1913 04/27/16 0842 04/27/16 1824 04/28/16 0816 04/28/16 1859  NA 132* 134* 135 137 138 137  K 4.5 4.6 4.9 5.5* 4.9 4.1  CL 97* 101 104 104 104 102  CO2 20* 23 19* '24 22 22  '$ GLUCOSE 108* 144* 167* 151* 141* 126*  BUN 56* 54* 53* 52* 50* 48*  CREATININE 4.49* 4.28* 3.47* 3.00* 2.61* 2.31*  CALCIUM 8.5* 8.7* 9.3 9.8 9.7 9.8  ALT 24  --   --   --   --   --   ALKPHOS 280*  --   --   --   --   --   BILITOT 0.5  --   --   --   --   --   PROT 6.2*  --   --   --   --   --   ALBUMIN 3.2*  --   --   --   --   --    BG:  Recent Labs Lab 04/28/16 0734 04/28/16 1205 04/28/16 1646 04/28/16 2106 04/29/16 0741  GLUCAP 135* 141* 195* 190* 162*   No results found for: HGBA1C  Microbiology: Urine culture drawn 9/10 No growth   Imaging: MRI thoracic spine 04/26/2016 1. Findings consistent with widespread osseous metastases involving the thoracic spine. No significant extra osseous or epidural tumor at this time. No evidence for cord compression or severe canal stenosis. 2. Pathologic fracture involving the T4 vertebral body with associated 50% height loss without bony retropulsion.  MRI lumbar spine 04/26/2016 1. Widespread osseous metastases involving the lumbar spine. 2. No evidence for cord compression. There is extraosseous extension of tumor at the right posterior aspect of the S2 segment, which could potentially affect the adjacent right sacral nerve roots. No other significant extraosseous extension of tumor. 3. Probable pathologic fracture involving L2 vertebral body with mild 10% height loss without bony retropulsion. 4. Multilevel degenerative spondylolysis and facet arthrosis as above, most prevalent at the L2-3 level were there is moderate canal stenosis. Patient is status post remote decompressive laminectomy at L3-4 and L4-5. Please see above  report for a full description of these findings.   Medications: Infusions:   Scheduled Medications: . atorvastatin  40 mg Oral q1800  . bisacodyl  10 mg Rectal Once  . carvedilol  6.25 mg Oral BID WC  . docusate sodium  100 mg Oral BID  . feeding supplement (GLUCERNA SHAKE)  237 mL Oral BID BM  . fentaNYL  12.5 mcg Transdermal Q72H  . gabapentin  400 mg Oral QHS  . heparin subcutaneous  5,000 Units Subcutaneous Q8H  . insulin aspart  0-5 Units Subcutaneous QHS  . insulin aspart  0-9 Units Subcutaneous TID WC  . pantoprazole  40 mg Oral Daily  . polyethylene glycol  17 g Oral Daily  . vitamin B-12  1,000 mcg Oral Daily   PRN Medications: oxyCODONE  Assessment/Plan: Pt is a 81 y.o. yo female with a PMHx of Recently diagnosed lung mass, hypertension, diabetes,  obesity who was admitted on 04/25/2016 with symptoms of pain, which was determined to be secondary to small lung metastasis.   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 - We'll try Soap suds enema today -Continue Colace and MiraLAX  AKI Creatinine continues to improve post catheter insertion, AKI is likely secondary to urinary retention. -Continued to trend BMET  Acute urinary retention Likely multifactorial and related to constipation and pain medications.  -Continue Foley catheter -voiding trial and kidney function improved -She'll be discharged with a Foley and will follow- with urology  -Avoid nephrotoxic medications (holding home ace inhibitor)  Diabetes She has had decreased appetite and has not been eating regularly. CBG have mostly trended less than 150. -Continue CBG and will add sensitive ISS if she begins eating and glucose increases  Hypertension she remained normotensive -Continue home Coreg, hold home lisinopril HCTZ in the setting of AKI   Dispo: Anticipated discharge in approximately 1-2 day(s).   LOS: 3 days   Ledell Noss, MD 04/29/2016, 10:32 AM Pager: [phone]

## 2016-04-29 NOTE — Progress Notes (Signed)
Physical Therapy Treatment Patient Details Name: Christina Estes MRN: [id number] DOB: [birth year] Today's Date: 04/29/2016    History of Present Illness 81 yo F with known lung mass suspicious for lung cancer presents with urinary retention, AKI, and increased back pain found to have widespread osseous metastatic process to L spine and Tspine without any cord compression.    PT Comments    Patient making slow progress with mobility.  Agree with need for SNF at d/c.   Follow Up Recommendations  SNF     Equipment Recommendations  3in1 (PT)    Recommendations for Other Services       Precautions / Restrictions Precautions Precautions: Fall Precaution Comments: Consider Back precautions for comfort Restrictions Weight Bearing Restrictions: No    Mobility  Bed Mobility Overal bed mobility: Needs Assistance Bed Mobility: Rolling;Sidelying to Sit Rolling: Min assist Sidelying to sit: Mod assist       General bed mobility comments: Patient able to roll with use of rail.  Mod assist and increased time to move trunk to sitting position.  Once upright, patient able to maintain sitting balance with min guard assist.  Transfers Overall transfer level: Needs assistance Equipment used: Rolling walker (2 wheeled) Transfers: Sit to/from [facility] Sit to Stand: Mod assist;+2 physical assistance Stand pivot transfers: Mod assist;+2 physical assistance       General transfer comment: Verbal cues for hand placement.  Assist to power up to standing position.  Patient able to reach fully upright position today.  Patient able to take several shuffle steps to pivot to chair with assist to maneuver RW and for balance.  Assist to control descent into chair.  Ambulation/Gait             General Gait Details: Unable   Stairs            Wheelchair Mobility    Modified Rankin (Stroke Patients Only)       Balance Overall balance assessment: Needs  assistance Sitting-balance support: No upper extremity supported;Feet supported Sitting balance-Leahy Scale: Fair Sitting balance - Comments: Able to maintain static sitting balance.   Standing balance support: Bilateral upper extremity supported Standing balance-Leahy Scale: Poor                      Cognition Arousal/Alertness: Awake/alert Behavior During Therapy: WFL for tasks assessed/performed Overall Cognitive Status: Within Functional Limits for tasks assessed                      Exercises      General Comments        Pertinent Vitals/Pain Pain Assessment: Faces Faces Pain Scale: Hurts little more Pain Location: Back Pain Descriptors / Indicators: Aching;Sore Pain Intervention(s): Monitored during session;Repositioned    Home Living                      Prior Function            PT Goals (current goals can now be found in the care plan section) Progress towards PT goals: Progressing toward goals    Frequency  Min 3X/week    PT Plan Current plan remains appropriate    Co-evaluation             End of Session Equipment Utilized During Treatment: Gait belt Activity Tolerance: Patient tolerated treatment well Patient left: in chair;with call bell/phone within reach;with chair alarm set;with family/visitor present     Time:  6184-8592 PT Time Calculation (min) (ACUTE ONLY): 10 min  Charges:  $Therapeutic Activity: 8-22 mins                    G Codes:      Despina Pole 05/19/16, 10:56 AM Carita Pian. Sanjuana Kava, [place] Pager [phone]

## 2016-04-29 NOTE — Progress Notes (Signed)
Internal Medicine Attending:   I saw and examined the patient. I reviewed the resident's note and I agree with the resident's findings and plan as documented in the resident's note. Will work on Opioid induced constipation today with enema.  Otherwise SCr continues to fall.  Plan for biopsy tomorrow with Dr Roxan Hockey and discharge after to SNF.

## 2016-04-30 ENCOUNTER — Inpatient Hospital Stay (HOSPITAL_COMMUNITY): Payer: Commercial Managed Care - HMO

## 2016-04-30 ENCOUNTER — Encounter (HOSPITAL_COMMUNITY)
Admission: EM | Disposition: A | Discharge status: Skilled Nursing Facility | Payer: Self-pay | Source: Home / Self Care | Attending: Internal Medicine

## 2016-04-30 ENCOUNTER — Inpatient Hospital Stay (HOSPITAL_COMMUNITY): Payer: Commercial Managed Care - HMO | Admitting: Certified Registered"

## 2016-04-30 ENCOUNTER — Ambulatory Visit: Payer: Commercial Managed Care - HMO | Admitting: Cardiovascular Disease

## 2016-04-30 DIAGNOSIS — C3400 Malignant neoplasm of unspecified main bronchus: Secondary | ICD-10-CM

## 2016-04-30 HISTORY — PX: VIDEO BRONCHOSCOPY: SHX5072

## 2016-04-30 LAB — GLUCOSE, CAPILLARY
GLUCOSE-CAPILLARY: 154 mg/dL — AB (ref 65–99)
GLUCOSE-CAPILLARY: 197 mg/dL — AB (ref 65–99)
GLUCOSE-CAPILLARY: 140 mg/dL — AB (ref 65–99)
Glucose-Capillary: 155 mg/dL — ABNORMAL HIGH (ref 65–99)

## 2016-04-30 LAB — BASIC METABOLIC PANEL
Anion gap: 10 (ref 5–15)
BUN: 44 mg/dL — AB (ref 6–20)
CHLORIDE: 106 mmol/L (ref 101–111)
CO2: 23 mmol/L (ref 22–32)
CREATININE: 1.55 mg/dL — AB (ref 0.44–1.00)
Calcium: 9.6 mg/dL (ref 8.9–10.3)
GFR calc Af Amer: 36 mL/min — ABNORMAL LOW (ref >60)
GFR calc non Af Amer: 31 mL/min — ABNORMAL LOW (ref >60)
GLUCOSE: 161 mg/dL — AB (ref 65–99)
Potassium: 3.8 mmol/L (ref 3.5–5.1)
Sodium: 139 mmol/L (ref 135–145)

## 2016-04-30 SURGERY — BRONCHOSCOPY, VIDEO-ASSISTED
Anesthesia: General

## 2016-04-30 MED ORDER — DEXTROSE 5 % IV SOLN
INTRAVENOUS | Status: DC | PRN
  Administered 2016-04-30: 30 ug/min via INTRAVENOUS

## 2016-04-30 MED ORDER — ROCURONIUM BROMIDE 10 MG/ML (PF) SYRINGE
PREFILLED_SYRINGE | INTRAVENOUS | Status: AC
  Filled 2016-04-30: qty 10

## 2016-04-30 MED ORDER — EPINEPHRINE HCL 1 MG/ML IJ SOLN
INTRAMUSCULAR | Status: AC
  Filled 2016-04-30: qty 1

## 2016-04-30 MED ORDER — LIDOCAINE 2% (20 MG/ML) 5 ML SYRINGE
INTRAMUSCULAR | Status: AC
  Filled 2016-04-30: qty 5

## 2016-04-30 MED ORDER — GADOBENATE DIMEGLUMINE 529 MG/ML IV SOLN
10.0000 mL | Freq: Once | INTRAVENOUS | Status: AC
  Administered 2016-04-30: 10 mL via INTRAVENOUS

## 2016-04-30 MED ORDER — ONDANSETRON HCL 4 MG/2ML IJ SOLN
INTRAMUSCULAR | Status: DC | PRN
  Administered 2016-04-30: 4 mg via INTRAVENOUS

## 2016-04-30 MED ORDER — EPHEDRINE SULFATE 50 MG/ML IJ SOLN
INTRAMUSCULAR | Status: DC | PRN
  Administered 2016-04-30 (×2): 15 mg via INTRAVENOUS

## 2016-04-30 MED ORDER — PROPOFOL 10 MG/ML IV BOLUS
INTRAVENOUS | Status: DC | PRN
  Administered 2016-04-30: 130 mg via INTRAVENOUS

## 2016-04-30 MED ORDER — LACTATED RINGERS IV SOLN
INTRAVENOUS | Status: DC | PRN
  Administered 2016-04-30: 07:00:00 via INTRAVENOUS

## 2016-04-30 MED ORDER — PROPOFOL 500 MG/50ML IV EMUL
INTRAVENOUS | Status: DC | PRN
Start: 2016-04-30 — End: 2016-04-30
  Administered 2016-04-30: 150 ug/kg/min via INTRAVENOUS

## 2016-04-30 MED ORDER — EPINEPHRINE HCL 1 MG/ML IJ SOLN
INTRAMUSCULAR | Status: DC | PRN
  Administered 2016-04-30: 1 mg via ENDOTRACHEOPULMONARY

## 2016-04-30 MED ORDER — LIDOCAINE HCL (CARDIAC) 20 MG/ML IV SOLN
INTRAVENOUS | Status: DC | PRN
  Administered 2016-04-30: 40 mg via INTRAVENOUS

## 2016-04-30 MED ORDER — 0.9 % SODIUM CHLORIDE (POUR BTL) OPTIME
TOPICAL | Status: DC | PRN
  Administered 2016-04-30: 1000 mL

## 2016-04-30 MED ORDER — PHENYLEPHRINE 40 MCG/ML (10ML) SYRINGE FOR IV PUSH (FOR BLOOD PRESSURE SUPPORT)
PREFILLED_SYRINGE | INTRAVENOUS | Status: AC
  Filled 2016-04-30: qty 10

## 2016-04-30 MED ORDER — SUGAMMADEX SODIUM 200 MG/2ML IV SOLN
INTRAVENOUS | Status: DC | PRN
  Administered 2016-04-30: 210 mg via INTRAVENOUS

## 2016-04-30 MED ORDER — FENTANYL CITRATE (PF) 100 MCG/2ML IJ SOLN
INTRAMUSCULAR | Status: AC
  Filled 2016-04-30: qty 2

## 2016-04-30 MED ORDER — PROPOFOL 10 MG/ML IV BOLUS
INTRAVENOUS | Status: AC
  Filled 2016-04-30: qty 20

## 2016-04-30 MED ORDER — FENTANYL CITRATE (PF) 100 MCG/2ML IJ SOLN
INTRAMUSCULAR | Status: DC | PRN
  Administered 2016-04-30 (×2): 50 ug via INTRAVENOUS

## 2016-04-30 MED ORDER — ROCURONIUM BROMIDE 100 MG/10ML IV SOLN
INTRAVENOUS | Status: DC | PRN
  Administered 2016-04-30: 60 mg via INTRAVENOUS

## 2016-04-30 SURGICAL SUPPLY — 31 items
BLOCK BITE 60FR ADLT L/F BLUE (MISCELLANEOUS) ×3 IMPLANT
BRUSH CYTOL CELLEBRITY 1.5X140 (MISCELLANEOUS) ×3 IMPLANT
CANISTER SUCTION 2500CC (MISCELLANEOUS) ×3 IMPLANT
CONT SPEC 4OZ CLIKSEAL STRL BL (MISCELLANEOUS) ×15 IMPLANT
COVER TABLE BACK 60X90 (DRAPES) ×3 IMPLANT
FILTER STRAW FLUID ASPIR (MISCELLANEOUS) ×3 IMPLANT
FORCEPS BIOP RJ4 1.8 (CUTTING FORCEPS) ×3 IMPLANT
FORCEPS RADIAL JAW LRG 4 PULM (INSTRUMENTS) ×1 IMPLANT
GAUZE SPONGE 4X4 12PLY STRL (GAUZE/BANDAGES/DRESSINGS) ×3 IMPLANT
GLOVE BIOGEL PI IND STRL 6.5 (GLOVE) ×1 IMPLANT
GLOVE BIOGEL PI INDICATOR 6.5 (GLOVE) ×2
GLOVE SURG SIGNA 7.5 PF LTX (GLOVE) ×3 IMPLANT
GOWN STRL REUS W/ TWL LRG LVL3 (GOWN DISPOSABLE) ×1 IMPLANT
GOWN STRL REUS W/ TWL XL LVL3 (GOWN DISPOSABLE) ×1 IMPLANT
GOWN STRL REUS W/TWL LRG LVL3 (GOWN DISPOSABLE) ×2
GOWN STRL REUS W/TWL XL LVL3 (GOWN DISPOSABLE) ×2
KIT CLEAN ENDO COMPLIANCE (KITS) ×9 IMPLANT
KIT ROOM TURNOVER OR (KITS) ×3 IMPLANT
NEEDLE BIOPSY TRANSBRONCH 21G (NEEDLE) ×3 IMPLANT
NS IRRIG 1000ML POUR BTL (IV SOLUTION) ×3 IMPLANT
OIL SILICONE PENTAX (PARTS (SERVICE/REPAIRS)) ×3 IMPLANT
PAD ARMBOARD 7.5X6 YLW CONV (MISCELLANEOUS) ×6 IMPLANT
RADIAL JAW LRG 4 PULMONARY (INSTRUMENTS) ×2
SOLUTION ANTI FOG 6CC (MISCELLANEOUS) ×3 IMPLANT
SYR 20ML ECCENTRIC (SYRINGE) ×6 IMPLANT
SYR 5ML LL (SYRINGE) ×3 IMPLANT
SYR 5ML LUER SLIP (SYRINGE) ×3 IMPLANT
TOWEL OR 17X24 6PK STRL BLUE (TOWEL DISPOSABLE) ×3 IMPLANT
TRAP SPECIMEN MUCOUS 40CC (MISCELLANEOUS) ×6 IMPLANT
TUBE CONNECTING 20'X1/4 (TUBING) ×1
TUBE CONNECTING 20X1/4 (TUBING) ×2 IMPLANT

## 2016-04-30 NOTE — Progress Notes (Signed)
Internal Medicine Attending:   I saw and examined the patient. I reviewed the resident's note and I agree with the resident's findings and plan as documented in the resident's note.  She apparently had a large BM in the bed last night which she attempted to clean up on her own, in the process she pulled out her foley catheter.  She was able to go for her biopsy today.  Her renal function continues to improve and we will see if she is able to urinate without the foley otherwise it will need to be replaced.  She was originally to have an outpatient MRI of the brain today per her oncologist to complete her workup, we will see if we can obtain this today to expedite her care.  Otherwise she is stable for discharge.

## 2016-04-30 NOTE — Interval H&P Note (Signed)
History and Physical Interval Note:  04/30/2016 7:24 AM  Christina Estes  has presented today for surgery, with the diagnosis of LUNG MASS  The various methods of treatment have been discussed with the patient and family. After consideration of risks, benefits and other options for treatment, the patient has consented to  Procedure(s): VIDEO BRONCHOSCOPY (N/A) as a surgical intervention .  The patient's history has been reviewed, patient examined, no change in status, stable for surgery.  I have reviewed the patient's chart and labs.  Questions were answered to the patient's satisfaction.     [name]

## 2016-04-30 NOTE — Anesthesia Postprocedure Evaluation (Signed)
Anesthesia Post Note  Patient: Christina Estes  Procedure(s) Performed: Procedure(s) (LRB): VIDEO BRONCHOSCOPY (N/A)  Patient location during evaluation: PACU Anesthesia Type: General Level of consciousness: awake Pain management: pain level controlled Vital Signs Assessment: post-procedure vital signs reviewed and stable Respiratory status: spontaneous breathing Cardiovascular status: stable Postop Assessment: no signs of nausea or vomiting Anesthetic complications: no    Last Vitals:  Vitals:   04/30/16 1037 04/30/16 1645  BP: (!) 123/48 136/65  Pulse: 66 66  Resp: 20 18  Temp: 36.3 C 36.8 C    Last Pain:  Vitals:   04/30/16 1807  TempSrc:   PainSc: 9                  Kentrail Shew

## 2016-04-30 NOTE — Clinical Social Work Note (Signed)
Clinical Social Work Assessment  Patient Details  Name: Christina Estes MRN: [id number] Date of Birth: 08/23/1934  Date of referral:  04/30/16               Reason for consult:  Facility Placement                Permission sought to share information with:  Facility Sport and exercise psychologist, Case Optician, dispensing granted to share information::  Yes, Verbal Permission Granted  Name::        Agency::   [facility])  Relationship::     Contact Information:     Housing/Transportation Living arrangements for the past 2 months:  [place] of Information:  Patient Patient Interpreter Needed:  None Criminal Activity/Legal Involvement Pertinent to Current Situation/Hospitalization:  No - Comment as needed Significant Relationships:  Spouse Lives with:  Spouse Do you feel safe going back to the place where you live?  No Need for family participation in patient care:  No (Coment)  Care giving concerns:  No caregivers present at time of this assessment   Social Worker assessment / plan:  CSW met with patient to discuss disposition.  Patient states she will be discharged to [facility] at time of discharge.  Transportation unknown at this time.  Patient is from home with spouse and limited assistance.  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  CSW offered support.  Emotional Assessment Appearance:  Appears stated age Attitude/Demeanor/Rapport:    Affect  (typically observed):  Accepting, Adaptable Orientation:  Oriented to Self, Oriented to Place, Oriented to  Time, Oriented to Situation Alcohol / Substance use:  Not Applicable Psych involvement (Current and /or in the community):  No (Comment)  Discharge Needs  Concerns to be addressed:  No discharge needs identified Readmission within the last 30 days:  No Current discharge risk:  None Barriers to Discharge:  Continued Medical Work up   Dulcy Fanny, LCSW 04/30/2016, 12:19 PM

## 2016-04-30 NOTE — Anesthesia Procedure Notes (Signed)
Procedure Name: Intubation Date/Time: 04/30/2016 7:46 AM Performed by: Lance Coon Pre-anesthesia Checklist: Patient identified, Emergency Drugs available, Suction available, Patient being monitored and Timeout performed Patient Re-evaluated:Patient Re-evaluated prior to inductionOxygen Delivery Method: Circle system utilized Preoxygenation: Pre-oxygenation with 100% oxygen Intubation Type: IV induction Ventilation: Mask ventilation without difficulty and Oral airway inserted - appropriate to patient size Laryngoscope Size: Miller and 2 Grade View: Grade I Tube type: Oral Tube size: 8.5 mm Number of attempts: 1 Airway Equipment and Method: Stylet Secured at: 21 cm Tube secured with: Tape Dental Injury: Teeth and Oropharynx as per pre-operative assessment

## 2016-04-30 NOTE — Transfer of Care (Signed)
Immediate Anesthesia Transfer of Care Note  Patient: [name]  Procedure(s) Performed: Procedure(s): VIDEO BRONCHOSCOPY (N/A)  Patient Location: PACU  Anesthesia Type:General  Level of Consciousness: awake and patient cooperative  Airway & Oxygen Therapy: Patient Spontanous Breathing and Patient connected to nasal cannula oxygen  Post-op Assessment: Report given to RN and Post -op Vital signs reviewed and stable  Post vital signs: Reviewed and stable  Last Vitals:  Vitals:   04/29/16 1937 04/30/16 0453  BP: (!) 121/46 134/67  Pulse: 64 62  Resp: 16 17  Temp: 37.1 C 37.2 C    Last Pain:  Vitals:   04/29/16 2100  TempSrc:   PainSc: 4       Patients Stated Pain Goal: 0 (06/05/10 7356)  Complications: No apparent anesthesia complications

## 2016-04-30 NOTE — Brief Op Note (Signed)
04/25/2016 - 04/30/2016  9:21 AM  PATIENT:  Christina Estes  81 y.o. female  PRE-OPERATIVE DIAGNOSIS:  left lung mass and multiple lytic thoracic spine metastases  POST-OPERATIVE DIAGNOSIS:  Non-small cell carcinoma- Stage IV  PROCEDURE:  Procedure(s): VIDEO BRONCHOSCOPY (N/A) with brushings, needle aspirations and biopsies  SURGEON:  Surgeon(s) and Role:    * [name], MD - Primary  ANESTHESIA:   general  EBL:  Total I/O In: -  Out: 5 [Blood:5]  BLOOD ADMINISTERED:none  DRAINS: none   LOCAL MEDICATIONS USED:  NONE  SPECIMEN:  Source of Specimen:  LLL mass  DISPOSITION OF SPECIMEN:  PATHOLOGY  PLAN OF CARE: To room, discharge later today  PATIENT DISPOSITION:  PACU - hemodynamically stable.   Delay start of Pharmacological VTE agent (>24hrs) due to surgical blood loss or risk of bleeding: not applicable

## 2016-04-30 NOTE — Progress Notes (Signed)
Subjective: [name] is feeling well today and free of new complaints. Her pain has been well controlled with the current medical regiment. Yesterday she had a bowel movement after receiving a soap suds enema. Her foley catheter was accidentally pulled out overnight when she got out of bed to clean herself up. Reinsertion of her foley catheter was being held off but she continued to retain urine and had a bladder scan >500 cc so foley cath was reinserted this afternoon.   Objective:  Vital signs in last 24 hours: Vitals:   04/29/16 0859 04/29/16 1811 04/29/16 1937 04/30/16 0453  BP: (!) 138/53 (!) 132/59 (!) 121/46 134/67  Pulse: 66 70 64 62  Resp: '18 18 16 17  '$ Temp: 98.1 F (36.7 C) 98.2 F (36.8 C) 98.7 F (37.1 C) 98.9 F (37.2 C)  TempSrc: Oral Oral Oral   SpO2: 96% 99% 93% 98%  Weight:      Height:       Physical Exam  Constitutional: She is oriented to person, place, and time. She appears well-developed and well-nourished. No distress.  Cardiovascular: Normal rate and regular rhythm.   No murmur heard. Pulmonary/Chest: She has no wheezes. She has no rales.  Abdominal: Soft. Bowel sounds are normal. She exhibits no distension. There is no tenderness.  Neurological: She is alert and oriented to person, place, and time.  Extremities: no calf tenderness, no peripheral edema  Labs: CBC:  Recent Labs Lab 04/26/16 0040 04/27/16 0431  WBC 13.6* 9.8  NEUTROABS 9.9*  --   HGB 10.5* 10.4*  HCT 31.4* 31.9*  MCV 81.8 81.4  PLT 263 785   Metabolic Panel:  Recent Labs Lab 04/26/16 0040  04/27/16 0842 04/27/16 1824 04/28/16 0816 04/28/16 1859 04/29/16 0954  NA 132*  < > 135 137 138 137 138  K 4.5  < > 4.9 5.5* 4.9 4.1 4.0  CL 97*  < > 104 104 104 102 104  CO2 20*  < > 19* '24 22 22 23  '$ GLUCOSE 108*  < > 167* 151* 141* 126* 214*  BUN 56*  < > 53* 52* 50* 48* 45*  CREATININE 4.49*  < > 3.47* 3.00* 2.61* 2.31* 1.90*  CALCIUM 8.5*  < > 9.3 9.8 9.7 9.8  9.7  ALT 24  --   --   --   --   --   --   ALKPHOS 280*  --   --   --   --   --   --   BILITOT 0.5  --   --   --   --   --   --   PROT 6.2*  --   --   --   --   --   --   ALBUMIN 3.2*  --   --   --   --   --   --   < > = values in this interval not displayed. BG:  Recent Labs Lab 04/28/16 2106 04/29/16 0741 04/29/16 1154 04/29/16 1713 04/29/16 2124  GLUCAP 190* 162* 196* 129* 176*   No results found for: HGBA1C  Imaging: MRI brain with and without contrast  1. Over 80 cerebral and infratentorial brain metastases. No vasogenic edema. 2. Tiny cortical infarct in the right frontal lobe. 3. 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    Medications: Infusions:   Scheduled Medications: . [MAR Hold] atorvastatin  40 mg Oral q1800  . [facility] Hold]  bisacodyl  10 mg Rectal Once  . [MAR Hold] carvedilol  6.25 mg Oral BID WC  . [MAR Hold] docusate sodium  100 mg Oral BID  . [MAR Hold] feeding supplement (GLUCERNA SHAKE)  237 mL Oral BID BM  . [MAR Hold] fentaNYL  12.5 mcg Transdermal Q72H  . [MAR Hold] gabapentin  400 mg Oral QHS  . [MAR Hold] heparin subcutaneous  5,000 Units Subcutaneous Q8H  . [MAR Hold] insulin aspart  0-5 Units Subcutaneous QHS  . [MAR Hold] insulin aspart  0-9 Units Subcutaneous TID WC  . [MAR Hold] pantoprazole  40 mg Oral Daily  . [MAR Hold] polyethylene glycol  17 g Oral Daily  . [MAR Hold] vitamin B-12  1,000 mcg Oral Daily   PRN Medications: 0.9 % irrigation (POUR BTL), EPINEPHrine, [MAR Hold] oxyCODONE  Assessment/Plan: Pt is a 81 y.o. yo female with a PMHx of Recently diagnosed lung mass, hypertension, diabetes, obesity who was admitted on 04/25/2016 with symptoms of pain, which was determined to be secondary to small lung metastasis.  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) Active Problems:   Essential hypertension   Diabetes ([place])   Adjustment disorder  with mixed anxiety and depressed mood   GERD (gastroesophageal reflux disease)   Lung mass   Acute urinary retention   AKI (acute kidney injury) ([place])  Constipation Resolved secondary soap suds enema yesterday  -Continue Colace and MiraLAX  AKI Creatinine continues to improve post catheter insertion, AKI is likely secondary to urinary retention. -Continued to trend BMET  Acute urinary retention Likely multifactorial and related to constipation and pain medications.  -Continue Foley catheter -voiding trial outpatient when kidney function improved -She'll be discharged with a Foley and will follow-up with urology  -Avoid nephrotoxic medications (holding home ace inhibitor)  Diabetes She has had decreased appetite and has not been eating regularly. CBG have trended less than 150. -Continue CBG and will add sensitive ISS if she begins eating and glucose increases  Hypertension she remained normotensive -Continue home Coreg, hold home lisinopril HCTZ in the setting of AKI   Dispo: Anticipated discharge in approximately 1-2 day(s).   LOS: 4 days   Ledell Noss, MD 04/30/2016, 8:22 AM Pager: [phone]

## 2016-04-30 NOTE — Clinical Social Work Note (Signed)
  Patient will discharge to [facility] when medically stable.  Projected dc: Saturday 9/16.  SNF is aware and agreeable.  Weekend CSW to obtain insurance authorization prior to dc. (Patient has [place]).  Nonnie Done, MSW, LCSW  [phone]  Licensed Clinical Social Worker

## 2016-04-30 NOTE — Progress Notes (Signed)
Patient pulled catheter out accidentally. Balloon intact. No bleeding noted. Assisted to the [facility]. Denies pain or discomfort. Will continue to monitor.

## 2016-04-30 NOTE — Anesthesia Preprocedure Evaluation (Signed)
Anesthesia Evaluation    Reviewed: Allergy & Precautions, NPO status , Patient's Chart, lab work & pertinent test results  Airway Mallampati: II  TM Distance: >3 FB Neck ROM: Full    Dental  (+) Dental Advisory Given   Pulmonary shortness of breath,    breath sounds clear to auscultation       Cardiovascular hypertension, Pt. on medications + CAD and + DOE   Rhythm:Regular     Neuro/Psych PSYCHIATRIC DISORDERS Anxiety Depression negative neurological ROS     GI/Hepatic Neg liver ROS, PUD, GERD  Medicated,  Endo/Other  diabetes, Type 2  Renal/GU Renal InsufficiencyRenal disease     Musculoskeletal   Abdominal   Peds  Hematology   Anesthesia Other Findings   Reproductive/Obstetrics                             Anesthesia Physical Anesthesia Plan  ASA: III  Anesthesia Plan: General   Post-op Pain Management:    Induction: Intravenous  Airway Management Planned: Oral ETT  Additional Equipment: None  Intra-op Plan:   Post-operative Plan: Extubation in OR  Informed Consent: I have reviewed the patients History and Physical, chart, labs and discussed the procedure including the risks, benefits and alternatives for the proposed anesthesia with the patient or authorized representative who has indicated his/her understanding and acceptance.   Dental advisory given  Plan Discussed with: CRNA and Surgeon  Anesthesia Plan Comments:         Anesthesia Quick Evaluation

## 2016-04-30 NOTE — Discharge Instructions (Signed)
Thank you for trusting [place] with your medical care!  You were hospitalized for Stage IV non small cell lung cancer with metastasis to spine and urinary retention and treated with a urinary catheter, anti constipation medications, and pain medications.   Please take note of the following changes to your medications: You will be discharged with a foley urinary catheter.  Continue taking all of your home medications.   To make sure you are getting better, please make it to the follow-up appointments listed on the first page.  If you have any questions, please call [phone].

## 2016-04-30 NOTE — H&P (View-Only) (Signed)
Reason for Consult:Left lung mass Referring Physician: [name], PA, Florene Route, MD (primary)  Christina Estes is an 81 y.o. female.  HPI: 81 yo woman presents with a cc/o back pain and urinary retention.  Christina Estes is a 81 year old woman with a past medical history of hypertension, hyperlipidemia, obesity, gastroesophageal reflux, anxiety and depression. She was admitted to [facility] in August after presenting with dysphagia, indigestion, and back pain. EGD revealed ulcers. She was treated for that.  She followed up with Dr. Pleas Koch with a complaint of 30 pound weight loss and ongoing back pain. Further workup revealed a 5 cm spiculated mass in the left lower lobe and thoracic vertebral body compression deformity concerning for possible metastasis. She was seen in consultation by Dr. Larey Seat and Kirby Crigler at the [facility]. She was scheduled for an appointment to see me on 04/26/2016 to discuss bronchoscopy to biopsy the lung mass. She was admitted to [facility] on 04/25/2016 with complaints of back pain and urinary retention.  She continues to have back pain. She denies shortness of breath. She has been confused since being in the hospital (at least). Most of the history is provided by her family. An MRI of her spine showed extensive metastases.  Past Medical History:  Diagnosis Date  . Anxiety   . Coronary artery disease   . Depression   . Diabetes mellitus without complication ([place])   . DOE (dyspnea on exertion)   . GERD (gastroesophageal reflux disease)   . Hypercholesteremia   . Hypertension   . Lung mass 04/22/2016  . Obesity     Past Surgical History:  Procedure Laterality Date  . CARDIAC CATHETERIZATION  12/11/2003  . CHOLECYSTECTOMY      Family History  Problem Relation Age of Onset  . Cancer Mother   . Diabetes Mellitus II Son   . Cancer Brother     Social History:  reports that she has never smoked. She has never used smokeless  tobacco. She reports that she does not drink alcohol. Her drug history is not on file.  Allergies: No Known Allergies  Medications:  Prior to Admission:  Prescriptions Prior to Admission  Medication Sig Dispense Refill Last Dose  . ALPRAZolam (XANAX) 1 MG tablet Take 1 mg by mouth at bedtime as needed for anxiety.   04/24/2016  . atorvastatin (LIPITOR) 40 MG tablet Take 40 mg by mouth daily.   04/25/2016 at Unknown time  . carvedilol (COREG) 6.25 MG tablet Take 6.25 mg by mouth 2 (two) times daily with a meal.   04/25/2016 at am   . cyclobenzaprine (FLEXERIL) 10 MG tablet Take 10 mg by mouth 3 (three) times daily as needed for muscle spasms.    04/24/2016  . esomeprazole (NEXIUM) 40 MG capsule    04/25/2016 at Unknown time  . fentaNYL (DURAGESIC - DOSED MCG/HR) 12 MCG/HR Place 1 patch (12.5 mcg total) onto the skin every 3 (three) days. 10 patch 0 04/25/2016 at Unknown time  . gabapentin (NEURONTIN) 400 MG capsule Take 400 mg by mouth at bedtime.   04/24/2016  . losartan-hydrochlorothiazide (HYZAAR) 100-25 MG tablet Take 1 tablet by mouth daily.   04/25/2016 at Unknown time  . metFORMIN (GLUCOPHAGE) 500 MG tablet Take 500 mg by mouth 2 (two) times daily with a meal.    04/25/2016 at am  . oxyCODONE (OXY IR/ROXICODONE) 5 MG immediate release tablet Take 1 tablet (5 mg total) by mouth every 4 (four) hours as needed for  severe pain. 60 tablet 0 04/24/2016  . traMADol (ULTRAM) 50 MG tablet Take 50 mg by mouth every 6 (six) hours as needed for moderate pain.    PRN  . vitamin B-12 (CYANOCOBALAMIN) 1000 MCG tablet Take 1,000 mcg by mouth daily.   04/25/2016 at Unknown time    Results for orders placed or performed during the hospital encounter of 04/25/16 (from the past 48 hour(s))  Urinalysis, Routine w reflex microscopic (not at [facility])     Status: Abnormal   Collection Time: 04/25/16 11:19 PM  Result Value Ref Range   Color, Urine YELLOW YELLOW   APPearance CLOUDY (A) CLEAR   Specific Gravity, Urine >1.030  (H) 1.005 - 1.030   pH 5.5 5.0 - 8.0   Glucose, UA NEGATIVE NEGATIVE mg/dL   Hgb urine dipstick NEGATIVE NEGATIVE   Bilirubin Urine NEGATIVE NEGATIVE   Ketones, ur TRACE (A) NEGATIVE mg/dL   Protein, ur NEGATIVE NEGATIVE mg/dL   Nitrite NEGATIVE NEGATIVE   Leukocytes, UA NEGATIVE NEGATIVE    Comment: MICROSCOPIC NOT DONE ON URINES WITH NEGATIVE PROTEIN, BLOOD, LEUKOCYTES, NITRITE, OR GLUCOSE <1000 mg/dL.  Urine culture     Status: None   Collection Time: 04/25/16 11:19 PM  Result Value Ref Range   Specimen Description URINE, CLEAN CATCH    Special Requests NONE    Culture NO GROWTH Performed at [facility]     Report Status 04/27/2016 FINAL   CBC with Differential     Status: Abnormal   Collection Time: 04/26/16 12:40 AM  Result Value Ref Range   WBC 13.6 (H) 4.0 - 10.5 K/uL   RBC 3.84 (L) 3.87 - 5.11 MIL/uL   Hemoglobin 10.5 (L) 12.0 - 15.0 g/dL   HCT 31.4 (L) 36.0 - 46.0 %   MCV 81.8 78.0 - 100.0 fL   MCH 27.3 26.0 - 34.0 pg   MCHC 33.4 30.0 - 36.0 g/dL   RDW 15.1 11.5 - 15.5 %   Platelets 268 150 - 400 K/uL   Neutrophils Relative % 72 %   Neutro Abs 9.9 (H) 1.7 - 7.7 K/uL   Lymphocytes Relative 19 %   Lymphs Abs 2.6 0.7 - 4.0 K/uL   Monocytes Relative 8 %   Monocytes Absolute 1.0 0.1 - 1.0 K/uL   Eosinophils Relative 1 %   Eosinophils Absolute 0.1 0.0 - 0.7 K/uL   Basophils Relative 0 %   Basophils Absolute 0.0 0.0 - 0.1 K/uL  Comprehensive metabolic panel     Status: Abnormal   Collection Time: 04/26/16 12:40 AM  Result Value Ref Range   Sodium 132 (L) 135 - 145 mmol/L   Potassium 4.5 3.5 - 5.1 mmol/L   Chloride 97 (L) 101 - 111 mmol/L   CO2 20 (L) 22 - 32 mmol/L   Glucose, Bld 108 (H) 65 - 99 mg/dL   BUN 56 (H) 6 - 20 mg/dL   Creatinine, Ser 4.49 (H) 0.44 - 1.00 mg/dL   Calcium 8.5 (L) 8.9 - 10.3 mg/dL   Total Protein 6.2 (L) 6.5 - 8.1 g/dL   Albumin 3.2 (L) 3.5 - 5.0 g/dL   AST 36 15 - 41 U/L   ALT 24 14 - 54 U/L   Alkaline Phosphatase 280 (H) 38  - 126 U/L   Total Bilirubin 0.5 0.3 - 1.2 mg/dL   GFR calc non Af Amer 8 (L) >60 mL/min   GFR calc Af Amer 10 (L) >60 mL/min    Comment: (NOTE) The eGFR  has been calculated using the CKD EPI equation. This calculation has not been validated in all clinical situations. eGFR's persistently <60 mL/min signify possible Chronic Kidney Disease.    Anion gap 15 5 - 15  Glucose, capillary     Status: Abnormal   Collection Time: 04/26/16  2:39 PM  Result Value Ref Range   Glucose-Capillary 155 (H) 65 - 99 mg/dL  Sodium, urine, random     Status: None   Collection Time: 04/26/16  3:04 PM  Result Value Ref Range   Sodium, Ur 51 mmol/L  Creatinine, urine, random     Status: None   Collection Time: 04/26/16  3:04 PM  Result Value Ref Range   Creatinine, Urine 55.42 mg/dL  Urinalysis, Routine w reflex microscopic (not at [facility])     Status: Abnormal   Collection Time: 04/26/16  3:04 PM  Result Value Ref Range   Color, Urine YELLOW YELLOW   APPearance CLOUDY (A) CLEAR   Specific Gravity, Urine 1.010 1.005 - 1.030   pH 5.5 5.0 - 8.0   Glucose, UA NEGATIVE NEGATIVE mg/dL   Hgb urine dipstick MODERATE (A) NEGATIVE   Bilirubin Urine NEGATIVE NEGATIVE   Ketones, ur NEGATIVE NEGATIVE mg/dL   Protein, ur NEGATIVE NEGATIVE mg/dL   Nitrite NEGATIVE NEGATIVE   Leukocytes, UA TRACE (A) NEGATIVE  Urine microscopic-add on     Status: Abnormal   Collection Time: 04/26/16  3:04 PM  Result Value Ref Range   Squamous Epithelial / LPF 0-5 (A) NONE SEEN   WBC, UA 6-30 0 - 5 WBC/hpf   RBC / HPF 0-5 0 - 5 RBC/hpf   Bacteria, UA RARE (A) NONE SEEN   Casts HYALINE CASTS (A) NEGATIVE   Urine-Other MUCOUS PRESENT   Glucose, capillary     Status: Abnormal   Collection Time: 04/26/16  4:40 PM  Result Value Ref Range   Glucose-Capillary 217 (H) 65 - 99 mg/dL  Basic metabolic panel     Status: Abnormal   Collection Time: 04/26/16  7:13 PM  Result Value Ref Range   Sodium 134 (L) 135 - 145 mmol/L   Potassium  4.6 3.5 - 5.1 mmol/L   Chloride 101 101 - 111 mmol/L   CO2 23 22 - 32 mmol/L   Glucose, Bld 144 (H) 65 - 99 mg/dL   BUN 54 (H) 6 - 20 mg/dL   Creatinine, Ser 4.28 (H) 0.44 - 1.00 mg/dL   Calcium 8.7 (L) 8.9 - 10.3 mg/dL   GFR calc non Af Amer 9 (L) >60 mL/min   GFR calc Af Amer 10 (L) >60 mL/min    Comment: (NOTE) The eGFR has been calculated using the CKD EPI equation. This calculation has not been validated in all clinical situations. eGFR's persistently <60 mL/min signify possible Chronic Kidney Disease.    Anion gap 10 5 - 15  Glucose, capillary     Status: Abnormal   Collection Time: 04/26/16  8:04 PM  Result Value Ref Range   Glucose-Capillary 119 (H) 65 - 99 mg/dL  CBC     Status: Abnormal   Collection Time: 04/27/16  4:31 AM  Result Value Ref Range   WBC 9.8 4.0 - 10.5 K/uL   RBC 3.92 3.87 - 5.11 MIL/uL   Hemoglobin 10.4 (L) 12.0 - 15.0 g/dL   HCT 31.9 (L) 36.0 - 46.0 %   MCV 81.4 78.0 - 100.0 fL   MCH 26.5 26.0 - 34.0 pg   MCHC 32.6 30.0 - 36.0  g/dL   RDW 14.9 11.5 - 15.5 %   Platelets 237 150 - 400 K/uL  Glucose, capillary     Status: Abnormal   Collection Time: 04/27/16  7:50 AM  Result Value Ref Range   Glucose-Capillary 142 (H) 65 - 99 mg/dL  Basic metabolic panel     Status: Abnormal   Collection Time: 04/27/16  8:42 AM  Result Value Ref Range   Sodium 135 135 - 145 mmol/L   Potassium 4.9 3.5 - 5.1 mmol/L   Chloride 104 101 - 111 mmol/L   CO2 19 (L) 22 - 32 mmol/L   Glucose, Bld 167 (H) 65 - 99 mg/dL   BUN 53 (H) 6 - 20 mg/dL   Creatinine, Ser 3.47 (H) 0.44 - 1.00 mg/dL   Calcium 9.3 8.9 - 10.3 mg/dL   GFR calc non Af Amer 12 (L) >60 mL/min   GFR calc Af Amer 13 (L) >60 mL/min    Comment: (NOTE) The eGFR has been calculated using the CKD EPI equation. This calculation has not been validated in all clinical situations. eGFR's persistently <60 mL/min signify possible Chronic Kidney Disease.    Anion gap 12 5 - 15  Glucose, capillary     Status:  Abnormal   Collection Time: 04/27/16 11:43 AM  Result Value Ref Range   Glucose-Capillary 137 (H) 65 - 99 mg/dL    Dg Thoracic Spine 2 View  Result Date: 04/26/2016 CLINICAL DATA:  Back pain, abdominal pain, constipation. Recent diagnosis of lung cancer. EXAM: THORACIC SPINE 2 VIEWS COMPARISON:  None. FINDINGS: Mild compression deformity of upper thoracic vertebra, tentatively identified T4. Remaining thoracic vertebral body heights are maintained. Multilevel degenerative change with endplate spurring is most prominent in the mid and lower thoracic spine. Left perihilar opacity projects over the midthoracic spine. IMPRESSION: Mild compression deformity of upper thoracic vertebra, tentatively identified at T4, acuity uncertain. Degenerative change in the mid lower thoracic spine. Electronically Signed   By: Jeb Levering M.D.   On: 04/26/2016 02:24   Dg Lumbar Spine Complete  Result Date: 04/26/2016 CLINICAL DATA:  Back pain, abdominal pain, constipation. Recent diagnosis of lung cancer. EXAM: LUMBAR SPINE - COMPLETE 4+ VIEW COMPARISON:  None. FINDINGS: Disc space narrowing most prominent at L5-S1. Lesser disc space narrowing at L3-L4 and L4-L5 with endplate spurring. Facet arthropathy from L3-L4 through the lumbosacral junction. Mild convex right scoliosis of the lower lumbar spine. No compression deformity or acute fracture. No gross evidence of bony destructive change. IMPRESSION: Degenerative disc disease and facet arthropathy in the lower lumbar spine. No radiographic evidence of acute osseous abnormality. Electronically Signed   By: Jeb Levering M.D.   On: 04/26/2016 02:22   Mr Thoracic Spine Wo Contrast  Result Date: 04/26/2016 CLINICAL DATA:  Initial evaluation for gradually worsening urinary tension for 3-4 days. Also with increasing back pain for several weeks. Recently diagnosed with lung cancer. With known pathologic fracture. EXAM: MRI THORACIC AND LUMBAR SPINE WITHOUT CONTRAST  TECHNIQUE: Multiplanar and multiecho pulse sequences of the thoracic and lumbar spine were obtained without intravenous contrast. COMPARISON:  None. FINDINGS: MRI THORACIC SPINE FINDINGS Alignment: Vertebral bodies normally aligned with preservation of the normal thoracic kyphosis. No listhesis. Vertebrae: Views of the cervical spine on the counter sequence demonstrate metastatic lesion within the C5 vertebral body. Mild multilevel degenerative changes without significant stenosis. Abnormal T1 hypo intense, mild stir hyperintense lesions seen throughout the thoracic spine, compatible with metastatic disease. Most notable lesion within the T4 vertebral  body which is largely replaced by tumor. There is an associated pathologic fracture with approximately 50% height loss. No significant bony retropulsion. Vertebral body heights otherwise maintained without additional pathologic fracture. Other prominent lesions present within the T1, T2, T6, T7, T8 comment T10, and L1 vertebral bodies. Probable small amount of extraosseous extension of tumor into the left T1-2 (series 5, image 13), T3-4 neural foramen (series 5, image 11) as well as T7-8 (series 5, image 10) no other significant extraosseous extension of tumor. Tumor noted within the right posterior eighth rib. Cord: Signal intensity within the thoracic spinal cord is normal. No significant epidural tumor. No evidence for cord compression. Paraspinal and other soft tissues: Paraspinous soft tissues demonstrate no acute abnormality. Partially visualized lungs are clear. Disc levels: Mild degenerative disc bulging noted at T1-2 and T12-L1 without stenosis. No other significant degenerative changes present within the thoracic spine. No significant canal stenosis. MRI LUMBAR SPINE FINDINGS Segmentation: Normal segmentation. Lowest well-formed disc labeled L5-S1. Alignment: Vertebral bodies normally aligned with preservation of the normal lumbar lordosis. No listhesis.  Vertebrae: Multiple wall osseous metastases are seen involving the lumbar spine and visualized sacrum. The most prominent of these is at L2 over the right aspect of the L2 vertebral body is largely replaced by tumor. Mild height loss at the superior endplate of L2 of approximately 10% suspicious for associated pathologic fracture. No bony retropulsion. Vertebral body heights otherwise maintained. Additional osseous metastasis present at nearly all levels, but most prominent at T12, L4 and the sacrum. There is extra osseous extension of tumor at the S2 segment, which could potentially affect the right sacral nerve roots at this level (series 15, image 6 6 on axial sagittal sequence, series 17, image 42 on axial sequence). No other significant extra osseous extension of tumor appreciated. Additional osseous metastasis noted within the partially visualized bones of the pelvis. Conus medullaris: Extends to the T12-L1 level and appears normal. No significant epidural tumor within the lumbar spine. No evidence for cord compression. Paraspinal and other soft tissues: Paraspinous soft tissues demonstrate no acute abnormality. Remote postsurgical changes noted within the lower back. Few scattered T2 hyperintense cyst noted within the visualized kidneys. Disc levels: L1-2: Disc desiccation without significant disc bulge. No stenosis. L2-3: Mild diffuse disc bulge with disc desiccation. Bilateral facet arthrosis with ligamentum flavum hypertrophy. Resultant moderate canal stenosis. No significant foraminal narrowing. L3-4: Degenerative intervertebral disc space narrowing with disc desiccation and diffuse disc bulge. Patient is status post remote decompressive laminectomy. No residual stenosis. Left worse than right facet arthrosis. Mild bilateral foraminal stenosis. L4-5: Diffuse disc bulge with disc desiccation and intervertebral disc space narrowing. Remote decompressive laminectomy. Mild bile facet hypertrophy. No residual  canal stenosis. Moderate left foraminal narrowing. L5-S1: Degenerative intervertebral disc space narrowing with disc desiccation and mild diffuse disc bulge. Mild facet arthrosis. No canal or foraminal stenosis. IMPRESSION: MR THORACIC SPINE IMPRESSION 1. Findings consistent with widespread osseous metastases involving the thoracic spine. No significant extra osseous or epidural tumor at this time. No evidence for cord compression or severe canal stenosis. 2. Pathologic fracture involving the T4 vertebral body with associated 50% height loss without bony retropulsion. MR LUMBAR SPINE IMPRESSION 1. Widespread osseous metastases involving the lumbar spine. 2. No evidence for cord compression. There is extraosseous extension of tumor at the right posterior aspect of the S2 segment, which could potentially affect the adjacent right sacral nerve roots. No other significant extraosseous extension of tumor. 3. Probable pathologic fracture involving L2 vertebral  body with mild 10% height loss without bony retropulsion. 4. Multilevel degenerative spondylolysis and facet arthrosis as above, most prevalent at the L2-3 level were there is moderate canal stenosis. Patient is status post remote decompressive laminectomy at L3-4 and L4-5. Please see above report for a full description of these findings. Electronically Signed   By: Jeannine Boga M.D.   On: 04/26/2016 07:16   Mr Lumbar Spine Wo Contrast  Result Date: 04/26/2016 CLINICAL DATA:  Initial evaluation for gradually worsening urinary tension for 3-4 days. Also with increasing back pain for several weeks. Recently diagnosed with lung cancer. With known pathologic fracture. EXAM: MRI THORACIC AND LUMBAR SPINE WITHOUT CONTRAST TECHNIQUE: Multiplanar and multiecho pulse sequences of the thoracic and lumbar spine were obtained without intravenous contrast. COMPARISON:  None. FINDINGS: MRI THORACIC SPINE FINDINGS Alignment: Vertebral bodies normally aligned with  preservation of the normal thoracic kyphosis. No listhesis. Vertebrae: Views of the cervical spine on the counter sequence demonstrate metastatic lesion within the C5 vertebral body. Mild multilevel degenerative changes without significant stenosis. Abnormal T1 hypo intense, mild stir hyperintense lesions seen throughout the thoracic spine, compatible with metastatic disease. Most notable lesion within the T4 vertebral body which is largely replaced by tumor. There is an associated pathologic fracture with approximately 50% height loss. No significant bony retropulsion. Vertebral body heights otherwise maintained without additional pathologic fracture. Other prominent lesions present within the T1, T2, T6, T7, T8 comment T10, and L1 vertebral bodies. Probable small amount of extraosseous extension of tumor into the left T1-2 (series 5, image 13), T3-4 neural foramen (series 5, image 11) as well as T7-8 (series 5, image 10) no other significant extraosseous extension of tumor. Tumor noted within the right posterior eighth rib. Cord: Signal intensity within the thoracic spinal cord is normal. No significant epidural tumor. No evidence for cord compression. Paraspinal and other soft tissues: Paraspinous soft tissues demonstrate no acute abnormality. Partially visualized lungs are clear. Disc levels: Mild degenerative disc bulging noted at T1-2 and T12-L1 without stenosis. No other significant degenerative changes present within the thoracic spine. No significant canal stenosis. MRI LUMBAR SPINE FINDINGS Segmentation: Normal segmentation. Lowest well-formed disc labeled L5-S1. Alignment: Vertebral bodies normally aligned with preservation of the normal lumbar lordosis. No listhesis. Vertebrae: Multiple wall osseous metastases are seen involving the lumbar spine and visualized sacrum. The most prominent of these is at L2 over the right aspect of the L2 vertebral body is largely replaced by tumor. Mild height loss at the  superior endplate of L2 of approximately 10% suspicious for associated pathologic fracture. No bony retropulsion. Vertebral body heights otherwise maintained. Additional osseous metastasis present at nearly all levels, but most prominent at T12, L4 and the sacrum. There is extra osseous extension of tumor at the S2 segment, which could potentially affect the right sacral nerve roots at this level (series 15, image 6 6 on axial sagittal sequence, series 17, image 42 on axial sequence). No other significant extra osseous extension of tumor appreciated. Additional osseous metastasis noted within the partially visualized bones of the pelvis. Conus medullaris: Extends to the T12-L1 level and appears normal. No significant epidural tumor within the lumbar spine. No evidence for cord compression. Paraspinal and other soft tissues: Paraspinous soft tissues demonstrate no acute abnormality. Remote postsurgical changes noted within the lower back. Few scattered T2 hyperintense cyst noted within the visualized kidneys. Disc levels: L1-2: Disc desiccation without significant disc bulge. No stenosis. L2-3: Mild diffuse disc bulge with disc desiccation. Bilateral facet  arthrosis with ligamentum flavum hypertrophy. Resultant moderate canal stenosis. No significant foraminal narrowing. L3-4: Degenerative intervertebral disc space narrowing with disc desiccation and diffuse disc bulge. Patient is status post remote decompressive laminectomy. No residual stenosis. Left worse than right facet arthrosis. Mild bilateral foraminal stenosis. L4-5: Diffuse disc bulge with disc desiccation and intervertebral disc space narrowing. Remote decompressive laminectomy. Mild bile facet hypertrophy. No residual canal stenosis. Moderate left foraminal narrowing. L5-S1: Degenerative intervertebral disc space narrowing with disc desiccation and mild diffuse disc bulge. Mild facet arthrosis. No canal or foraminal stenosis. IMPRESSION: MR THORACIC SPINE  IMPRESSION 1. Findings consistent with widespread osseous metastases involving the thoracic spine. No significant extra osseous or epidural tumor at this time. No evidence for cord compression or severe canal stenosis. 2. Pathologic fracture involving the T4 vertebral body with associated 50% height loss without bony retropulsion. MR LUMBAR SPINE IMPRESSION 1. Widespread osseous metastases involving the lumbar spine. 2. No evidence for cord compression. There is extraosseous extension of tumor at the right posterior aspect of the S2 segment, which could potentially affect the adjacent right sacral nerve roots. No other significant extraosseous extension of tumor. 3. Probable pathologic fracture involving L2 vertebral body with mild 10% height loss without bony retropulsion. 4. Multilevel degenerative spondylolysis and facet arthrosis as above, most prevalent at the L2-3 level were there is moderate canal stenosis. Patient is status post remote decompressive laminectomy at L3-4 and L4-5. Please see above report for a full description of these findings. Electronically Signed   By: Jeannine Boga M.D.   On: 04/26/2016 07:16   US Renal  Result Date: 04/26/2016 CLINICAL DATA:  Urinary retention EXAM: RENAL / URINARY TRACT ULTRASOUND COMPLETE COMPARISON:  None. FINDINGS: Right Kidney: Length: 12.4 cm. Echogenicity within normal limits. No mass or hydronephrosis visualized. Left Kidney: Length: 12.0 cm. Echogenicity within normal limits. No mass or hydronephrosis visualized. Bladder: Decompressed with Foley catheter in place, not visualized IMPRESSION: No acute findings.  No hydronephrosis. Electronically Signed   By: Rolm Baptise M.D.   On: 04/26/2016 09:02   Dg Abdomen Acute W/chest  Result Date: 04/26/2016 CLINICAL DATA:  Abdominal pain and constipation. Back pain. Recent diagnosis of lung cancer. EXAM: DG ABDOMEN ACUTE W/ 1V CHEST COMPARISON:  None.  No prior exams for comparison. FINDINGS: There is  cardiomegaly. Prominent left perihilar soft tissue. Left lung base opacity is nonspecific. No definite pleural fluid. Right lung is grossly clear. No free intra-abdominal air. No dilated bowel loops to suggest obstruction. There is increased stool in the distal sigmoid colon and rectum. No evidence of radiopaque calculi. No acute osseous abnormalities seen radiographically. IMPRESSION: 1. Increased stool in the distal sigmoid colon and rectum, suggesting constipation. No bowel obstruction. 2. Left perihilar and left lung base opacity. This is nonspecific. Recommend correlation with outside imaging given recent diagnosis of lung cancer. Electronically Signed   By: Jeb Levering M.D.   On: 04/26/2016 02:20    Review of Systems  Constitutional: Positive for malaise/fatigue. Negative for chills and fever.  Eyes: Positive for blurred vision. Negative for double vision.  Respiratory: Positive for shortness of breath.   Cardiovascular: Negative for chest pain.  Gastrointestinal: Positive for abdominal pain and heartburn.  Genitourinary:       Urinary retention  Musculoskeletal: Positive for back pain.  Neurological: Positive for weakness. Negative for seizures, loss of consciousness and headaches.  Psychiatric/Behavioral: Positive for depression.   Blood pressure 114/61, pulse 71, temperature 98 F (36.7 C), temperature source Oral, resp.  rate 19, height 5' 8"  (1.727 m), weight 229 lb 8 oz (104.1 kg), SpO2 92 %. Physical Exam  Vitals reviewed. Constitutional:  obese  HENT:  Head: Normocephalic and atraumatic.  Eyes: Conjunctivae and EOM are normal. No scleral icterus.  Neck: Neck supple.  Cardiovascular: Normal rate, regular rhythm and normal heart sounds.   Respiratory: Effort normal and breath sounds normal. No respiratory distress. She has no wheezes.  GI: Soft. She exhibits no distension. There is no tenderness.  Lymphadenopathy:    She has no cervical adenopathy.  Neurological: She is  alert. No cranial nerve deficit.  confused  Skin: Skin is warm and dry.    Assessment/Plan: 81 year old woman who is a lifelong nonsmoker who presents with back pain and a 30 pound weight loss over 3 months. She has a left lung mass and multiple lytic thoracic spine metastases. This is most consistent with stage IV lung cancer. She needs a soft tissue biopsy in preference to a bone biopsy due to the need for molecular testing since she is a lifelong nonsmoker.  I recommended to the patient and her family that she undergo bronchoscopy for biopsy. They understand that this is a diagnostic procedure and not therapeutic in any way. They understand that no guarantee can be given a definitive biopsy. We will plan to do this in the operating room under general anesthesia so that we could do extensive sampling in hopes having enough tissue for molecular testing. They understand the indications, risks, benefits, and alternatives. They understand the risks include those associated with general anesthesia such as MI, stroke, DVT, PE. There are also procedure specific risks such as bleeding, pneumothorax, and failure to make a diagnosis.  Unfortunately the schedule will not allow me to do bronchoscopy tomorrow. I do have an opening on Friday. If she is ready to be discharged prior to that the procedure could be done on an outpatient basis.  [name] 04/27/2016, 3:09 PM

## 2016-05-01 LAB — BASIC METABOLIC PANEL
Anion gap: 8 (ref 5–15)
BUN: 37 mg/dL — AB (ref 6–20)
CHLORIDE: 106 mmol/L (ref 101–111)
CO2: 27 mmol/L (ref 22–32)
CREATININE: 1.31 mg/dL — AB (ref 0.44–1.00)
Calcium: 9.8 mg/dL (ref 8.9–10.3)
GFR, EST AFRICAN AMERICAN: 44 mL/min — AB (ref >60)
GFR, EST NON AFRICAN AMERICAN: 38 mL/min — AB (ref >60)
Glucose, Bld: 161 mg/dL — ABNORMAL HIGH (ref 65–99)
POTASSIUM: 4.6 mmol/L (ref 3.5–5.1)
SODIUM: 141 mmol/L (ref 135–145)

## 2016-05-01 LAB — GLUCOSE, CAPILLARY
GLUCOSE-CAPILLARY: 138 mg/dL — AB (ref 65–99)
GLUCOSE-CAPILLARY: 149 mg/dL — AB (ref 65–99)

## 2016-05-01 MED ORDER — CYCLOBENZAPRINE HCL 10 MG PO TABS: 10.0000 mg | ORAL_TABLET | Freq: Three times a day (TID) | ORAL | 0 refills | Status: DC | PRN

## 2016-05-01 MED ORDER — INFLUENZA VAC SPLIT QUAD 0.5 ML IM SUSY: 0.5000 mL | PREFILLED_SYRINGE | INTRAMUSCULAR | Status: DC

## 2016-05-01 MED ORDER — TRAMADOL HCL 50 MG PO TABS: 50.0000 mg | ORAL_TABLET | Freq: Four times a day (QID) | ORAL | 0 refills | Status: DC | PRN

## 2016-05-01 MED ORDER — ALPRAZOLAM 1 MG PO TABS: 1.0000 mg | ORAL_TABLET | Freq: Every evening | ORAL | 0 refills | Status: DC | PRN

## 2016-05-01 MED ORDER — FENTANYL 12 MCG/HR TD PT72: 12.5000 ug | MEDICATED_PATCH | TRANSDERMAL | 0 refills | Status: DC

## 2016-05-01 MED ORDER — OXYCODONE HCL 5 MG PO TABS: 5.0000 mg | ORAL_TABLET | ORAL | 0 refills | Status: DC | PRN

## 2016-05-01 NOTE — Progress Notes (Signed)
Subjective: [name] has no complaints this morning. She is aware of her MRI brain results.   Objective:  Vital signs in last 24 hours: Vitals:   04/30/16 1645 04/30/16 2042 05/01/16 0454 05/01/16 0758  BP: 136/65 (!) 160/59 (!) 184/65 (!) 133/55  Pulse: 66 71 70 63  Resp: '18 18 19 18  '$ Temp: 98.3 F (36.8 C) 98.4 F (36.9 C) 97.8 F (36.6 C) 97.7 F (36.5 C)  TempSrc: Oral Oral Oral Oral  SpO2: 95% 97% 96% 97%  Weight:  230 lb 13.2 oz (104.7 kg)    Height:       Physical Exam  Constitutional: She is oriented to person, place, and time. She appears well-developed and well-nourished. No distress.  Cardiovascular: Normal rate and regular rhythm.   No murmur heard. Pulmonary/Chest: She has no wheezes. She has no rales.  Abdominal: Soft. Bowel sounds are normal. She exhibits no distension. There is no tenderness.  Neurological: She is alert and oriented to person, place, and time.    Labs: CBC:  Recent Labs Lab 04/26/16 0040 04/27/16 0431  WBC 13.6* 9.8  NEUTROABS 9.9*  --   HGB 10.5* 10.4*  HCT 31.4* 31.9*  MCV 81.8 81.4  PLT 488 891   Metabolic Panel:  Recent Labs Lab 04/26/16 0040  04/28/16 0816 04/28/16 1859 04/29/16 0954 04/30/16 1013 05/01/16 0300  NA 132*  < > 138 137 138 139 141  K 4.5  < > 4.9 4.1 4.0 3.8 4.6  CL 97*  < > 104 102 104 106 106  CO2 20*  < > '22 22 23 23 27  '$ GLUCOSE 108*  < > 141* 126* 214* 161* 161*  BUN 56*  < > 50* 48* 45* 44* 37*  CREATININE 4.49*  < > 2.61* 2.31* 1.90* 1.55* 1.31*  CALCIUM 8.5*  < > 9.7 9.8 9.7 9.6 9.8  ALT 24  --   --   --   --   --   --   ALKPHOS 280*  --   --   --   --   --   --   BILITOT 0.5  --   --   --   --   --   --   PROT 6.2*  --   --   --   --   --   --   ALBUMIN 3.2*  --   --   --   --   --   --   < > = values in this interval not displayed. BG:  Recent Labs Lab 04/30/16 1159 04/30/16 1642 04/30/16 2038 05/01/16 0754 05/01/16 1137  GLUCAP 154* 197* 140* 138* 149*   No  results found for: HGBA1C  Imaging: MRI brain with and without contrast  1. Over 80 cerebral and infratentorial brain metastases. No vasogenic edema. 2. Tiny cortical infarct in the right frontal lobe. 3. 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    Medications: Infusions:   Scheduled Medications: . atorvastatin  40 mg Oral q1800  . bisacodyl  10 mg Rectal Once  . carvedilol  6.25 mg Oral BID WC  . docusate sodium  100 mg Oral BID  . feeding supplement (GLUCERNA SHAKE)  237 mL Oral BID BM  . fentaNYL  12.5 mcg Transdermal Q72H  . gabapentin  400 mg Oral QHS  . heparin subcutaneous  5,000 Units Subcutaneous Q8H  . [START ON 05/02/2016] Influenza vac split quadrivalent PF  0.5 mL Intramuscular Tomorrow-1000  . insulin aspart  0-5 Units Subcutaneous QHS  . insulin aspart  0-9 Units Subcutaneous TID WC  . pantoprazole  40 mg Oral Daily  . polyethylene glycol  17 g Oral Daily  . vitamin B-12  1,000 mcg Oral Daily   PRN Medications: oxyCODONE  Assessment/Plan: Pt is a 81 y.o. yo female with a PMHx of Recently diagnosed lung mass, hypertension, diabetes, obesity who was admitted on 04/25/2016 with symptoms of pain, which was determined to be secondary to small lung metastasis.  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 We also obtained a MRI of head that was + >80 mets. She has follow up with oncology as outpatient.   AKI Creatinine continues to improve post catheter insertion, AKI is likely secondary to urinary retention.  Acute  urinary retention-  Likely multifactorial and related to constipation and pain medications.  -Continue Foley catheter -voiding trial outpatient when kidney function improved -She'll be discharged with a Foley and will follow-up with urology  -Avoid nephrotoxic medications (holding home ace inhibitor)  Hypertension  -Continue home Coreg, hold home lisinopril HCTZ in the setting of AKI   Dispo: d/c to SNF today  LOS: 5 days   Christina Herrlich, MD 05/01/2016, 1:44 PM Pager: [phone]

## 2016-05-01 NOTE — Progress Notes (Signed)
Internal Medicine Attending:   I saw and examined the patient. I reviewed the resident's note and I agree with the resident's findings and plan as documented in the resident's note.  

## 2016-05-01 NOTE — Clinical Social Work Placement (Signed)
   CLINICAL SOCIAL WORK PLACEMENT  NOTE  Date:  05/01/2016  Patient Details  Name: Christina Estes MRN: [id number] Date of Birth: November 21, 1934  Clinical Social Work is seeking post-discharge placement for this patient at the [place] level of care (*CSW will initial, date and re-position this form in  chart as items are completed):  Yes   Patient/family provided with [facility] Department's list of facilities offering this level of care within the geographic area requested by the patient (or if unable, by the patient's family).  Yes   Patient/family informed of their freedom to choose among providers that offer the needed level of care, that participate in Medicare, Medicaid or managed care program needed by the patient, have an available bed and are willing to accept the patient.  Yes   Patient/family informed of [facility]'s ownership interest in [facility] and [facility], as well as of the fact that they are under no obligation to receive care at these facilities.  PASRR submitted to EDS on 04/28/16     PASRR number received on  (under review had to submit 30-day note, H&P and FL2)     Existing PASRR number confirmed on       FL2 transmitted to all facilities in geographic area requested by pt/family on 04/29/16     FL2 transmitted to all facilities within larger geographic area on       Patient informed that his/her managed care company has contracts with or will negotiate with certain facilities, including the following:        Yes   Patient/family informed of bed offers received.  Patient chooses bed at [facility]     Physician recommends and patient chooses bed at      Patient to be transferred to [facility] on 05/01/16.  Patient to be transferred to facility by Ambulance     Patient family notified on 05/01/16 of transfer.  Name of family member notified:  Carloyn Manner     PHYSICIAN Please sign FL2, Please  prepare prescriptions, Please prepare priority discharge summary, including medications     Additional Comment:  Per MD patient ready for DC to [facility]. RN, patient, patient's family, and facility notified of DC. RN given number for report. DC packet on chart. Ambulance transport requested for patient for 1:30PM pickup. MD notified of need for signed prescriptions. CSW signing off.   _______________________________________________ Rigoberto Noel, LCSW 05/01/2016, 11:42 AM

## 2016-05-01 NOTE — Op Note (Signed)
NAMEJERONDA, DON NO.:  [id number]  MEDICAL RECORD NO.:  [id number]  LOCATION:  6E07C                        FACILITY:  [place]  PHYSICIAN:  Revonda Standard. Roxan Hockey, M.D.DATE OF BIRTH:  28-Jul-1935  DATE OF PROCEDURE:  04/29/2016 DATE OF DISCHARGE:                              OPERATIVE REPORT   PREOPERATIVE DIAGNOSIS:  Left lung mass.  POSTOPERATIVE DIAGNOSIS:  Non-small cell carcinoma, clinical stage IV.  PROCEDURE:  Bronchoscopy with brushings, needle aspirations, and biopsies.  SURGEON:  Revonda Standard. Roxan Hockey, M.D.  ANESTHESIA:  General.  FINDINGS:  Multiple brushings showed benign bronchial cells.  Final brushing showed non-small cell carcinoma.  Biopsies sent for permanent pathology.  CLINICAL NOTE:  Ms. Hollinshead is a 81 year old woman, who presented with back pain.  Workup revealed multiple lytic spinal lesions and a left lung mass consistent with stage IV lung cancer.  She needs a biopsy for diagnostic purposes and to guide treatment.  The indications, risks, benefits, and alternatives were discussed in detail with the patient. She understood and accepted the risks and agreed to proceed.  DESCRIPTION OF PROCEDURE:  Ms. Craigo was brought to the operating room on April 29, 2016.  She was anesthetized and intubated. Flexible fiberoptic bronchoscopy was performed via the endotracheal tube.  It revealed normal endobronchial anatomy with no endobronchial lesions to the level of subsegmental bronchi.  Fluoroscopy was used during sampling.  Total fluoroscopy time was 5-1/2 minutes.  The posterior and lateral segmental bronchi of the basilar segments of the left lower lobe were identified and multiple samples were taken from each of these. Brushings and needle aspirations were performed and multiple biopsies were obtained.  After the brushings were obtained, they were sent for quick prep.  While awaiting these results, additional samples were  taken again beginning with brushings and proceeding with biopsies.  The initial 2 sets came back showing benign bronchial cells. Needle aspirations were minimally cellular.  Third and fourth samples were obtained, and finally the final brushings showed non-small cell carcinoma.  Multiple biopsies were sent from this subsegmental bronchus. These were all sent for permanent pathology.  There was some bleeding. Dilute epinephrine was applied and the bleeding stopped.  The patient then was extubated in the operating room and taken to postanesthetic care unit in good condition.     Revonda Standard Roxan Hockey, M.D.     SCH/MEDQ  D:  04/30/2016  T:  05/01/2016  Job:  [id number]

## 2016-05-03 ENCOUNTER — Encounter (HOSPITAL_COMMUNITY): Payer: Self-pay | Admitting: Thoracic Surgery (Cardiothoracic Vascular Surgery)

## 2016-05-07 ENCOUNTER — Encounter (HOSPITAL_BASED_OUTPATIENT_CLINIC_OR_DEPARTMENT_OTHER): Payer: Commercial Managed Care - HMO | Admitting: Hematology & Oncology

## 2016-05-07 ENCOUNTER — Encounter (HOSPITAL_COMMUNITY): Payer: Commercial Managed Care - HMO

## 2016-05-07 ENCOUNTER — Encounter (HOSPITAL_COMMUNITY): Payer: Self-pay | Admitting: Hematology & Oncology

## 2016-05-07 ENCOUNTER — Encounter (HOSPITAL_COMMUNITY): Payer: Self-pay | Admitting: Lab

## 2016-05-07 VITALS — BP 113/45 | HR 70 | Temp 97.6°F | Resp 16 | Wt 205.0 lb

## 2016-05-07 DIAGNOSIS — M546 Pain in thoracic spine: Secondary | ICD-10-CM | POA: Diagnosis not present

## 2016-05-07 DIAGNOSIS — R627 Adult failure to thrive: Secondary | ICD-10-CM | POA: Diagnosis not present

## 2016-05-07 DIAGNOSIS — E669 Obesity, unspecified: Secondary | ICD-10-CM | POA: Diagnosis not present

## 2016-05-07 DIAGNOSIS — R1310 Dysphagia, unspecified: Secondary | ICD-10-CM | POA: Diagnosis not present

## 2016-05-07 DIAGNOSIS — F329 Major depressive disorder, single episode, unspecified: Secondary | ICD-10-CM | POA: Diagnosis not present

## 2016-05-07 DIAGNOSIS — R918 Other nonspecific abnormal finding of lung field: Secondary | ICD-10-CM | POA: Diagnosis present

## 2016-05-07 DIAGNOSIS — C3492 Malignant neoplasm of unspecified part of left bronchus or lung: Secondary | ICD-10-CM | POA: Diagnosis not present

## 2016-05-07 DIAGNOSIS — I10 Essential (primary) hypertension: Secondary | ICD-10-CM | POA: Diagnosis not present

## 2016-05-07 DIAGNOSIS — K219 Gastro-esophageal reflux disease without esophagitis: Secondary | ICD-10-CM | POA: Diagnosis not present

## 2016-05-07 DIAGNOSIS — C7951 Secondary malignant neoplasm of bone: Secondary | ICD-10-CM | POA: Diagnosis not present

## 2016-05-07 DIAGNOSIS — Z7189 Other specified counseling: Secondary | ICD-10-CM

## 2016-05-07 DIAGNOSIS — I2510 Atherosclerotic heart disease of native coronary artery without angina pectoris: Secondary | ICD-10-CM | POA: Diagnosis not present

## 2016-05-07 DIAGNOSIS — E7800 Pure hypercholesterolemia, unspecified: Secondary | ICD-10-CM | POA: Diagnosis not present

## 2016-05-07 DIAGNOSIS — Z9889 Other specified postprocedural states: Secondary | ICD-10-CM | POA: Diagnosis not present

## 2016-05-07 DIAGNOSIS — Z9049 Acquired absence of other specified parts of digestive tract: Secondary | ICD-10-CM | POA: Diagnosis not present

## 2016-05-07 DIAGNOSIS — Z8711 Personal history of peptic ulcer disease: Secondary | ICD-10-CM | POA: Diagnosis not present

## 2016-05-07 DIAGNOSIS — Z85828 Personal history of other malignant neoplasm of skin: Secondary | ICD-10-CM | POA: Diagnosis not present

## 2016-05-07 DIAGNOSIS — Z7984 Long term (current) use of oral hypoglycemic drugs: Secondary | ICD-10-CM | POA: Diagnosis not present

## 2016-05-07 DIAGNOSIS — F419 Anxiety disorder, unspecified: Secondary | ICD-10-CM | POA: Diagnosis not present

## 2016-05-07 DIAGNOSIS — L8990 Pressure ulcer of unspecified site, unspecified stage: Secondary | ICD-10-CM

## 2016-05-07 DIAGNOSIS — C4362 Malignant melanoma of left upper limb, including shoulder: Secondary | ICD-10-CM | POA: Diagnosis not present

## 2016-05-07 DIAGNOSIS — R634 Abnormal weight loss: Secondary | ICD-10-CM | POA: Diagnosis not present

## 2016-05-07 DIAGNOSIS — E119 Type 2 diabetes mellitus without complications: Secondary | ICD-10-CM | POA: Diagnosis not present

## 2016-05-07 DIAGNOSIS — Z79899 Other long term (current) drug therapy: Secondary | ICD-10-CM | POA: Diagnosis not present

## 2016-05-07 DIAGNOSIS — L89321 Pressure ulcer of left buttock, stage 1: Secondary | ICD-10-CM | POA: Diagnosis not present

## 2016-05-07 DIAGNOSIS — N179 Acute kidney failure, unspecified: Secondary | ICD-10-CM | POA: Diagnosis not present

## 2016-05-07 DIAGNOSIS — R339 Retention of urine, unspecified: Secondary | ICD-10-CM | POA: Diagnosis not present

## 2016-05-07 DIAGNOSIS — M545 Low back pain: Secondary | ICD-10-CM | POA: Diagnosis not present

## 2016-05-07 LAB — COMPREHENSIVE METABOLIC PANEL
ALT: 52 U/L (ref 14–54)
ANION GAP: 13 (ref 5–15)
AST: 40 U/L (ref 15–41)
Albumin: 3.4 g/dL — ABNORMAL LOW (ref 3.5–5.0)
Alkaline Phosphatase: 231 U/L — ABNORMAL HIGH (ref 38–126)
BUN: 38 mg/dL — ABNORMAL HIGH (ref 6–20)
CALCIUM: 9.5 mg/dL (ref 8.9–10.3)
CHLORIDE: 101 mmol/L (ref 101–111)
CO2: 22 mmol/L (ref 22–32)
CREATININE: 1.21 mg/dL — AB (ref 0.44–1.00)
GFR, EST AFRICAN AMERICAN: 48 mL/min — AB (ref >60)
GFR, EST NON AFRICAN AMERICAN: 41 mL/min — AB (ref >60)
Glucose, Bld: 152 mg/dL — ABNORMAL HIGH (ref 65–99)
Potassium: 4.1 mmol/L (ref 3.5–5.1)
SODIUM: 136 mmol/L (ref 135–145)
Total Bilirubin: 0.7 mg/dL (ref 0.3–1.2)
Total Protein: 7.2 g/dL (ref 6.5–8.1)

## 2016-05-07 LAB — CBC WITH DIFFERENTIAL/PLATELET
Basophils Absolute: 0 10*3/uL (ref 0.0–0.1)
Basophils Relative: 0 %
EOS ABS: 0.2 10*3/uL (ref 0.0–0.7)
EOS PCT: 2 %
HCT: 36.4 % (ref 36.0–46.0)
Hemoglobin: 11.9 g/dL — ABNORMAL LOW (ref 12.0–15.0)
LYMPHS ABS: 2.1 10*3/uL (ref 0.7–4.0)
LYMPHS PCT: 18 %
MCH: 27.2 pg (ref 26.0–34.0)
MCHC: 32.7 g/dL (ref 30.0–36.0)
MCV: 83.1 fL (ref 78.0–100.0)
MONO ABS: 0.7 10*3/uL (ref 0.1–1.0)
MONOS PCT: 6 %
Neutro Abs: 8.6 10*3/uL — ABNORMAL HIGH (ref 1.7–7.7)
Neutrophils Relative %: 74 %
PLATELETS: 328 10*3/uL (ref 150–400)
RBC: 4.38 MIL/uL (ref 3.87–5.11)
RDW: 15.3 % (ref 11.5–15.5)
WBC: 11.6 10*3/uL — ABNORMAL HIGH (ref 4.0–10.5)

## 2016-05-07 MED ORDER — DRONABINOL 2.5 MG PO CAPS: 2.5000 mg | ORAL_CAPSULE | Freq: Two times a day (BID) | ORAL | 3 refills | Status: DC

## 2016-05-07 NOTE — Progress Notes (Signed)
[facility] Hematology/Oncology Progress Note  Name: Christina Estes      MRN: [id number]    Location: Room/bed info not found  Date: 05/12/2016 Time:10:31 AM   REFERRING PHYSICIAN:  Judd Lien, MD (Primary Care Provider)  REASON FOR CONSULT:  Lung mass    Adenocarcinoma of left lung ([place])   04/25/2016 - 05/01/2016 Hospital Admission    Urinary retention, AKI      04/26/2016 Initial Diagnosis    Adenocarcinoma of left lung ([place])     04/26/2016 Imaging    MRI Lumbar/thoracic spine: Widespread osseous metastases involving the lumbar spine.No evidence for cord compression. There is extraosseous extension of tumor at the right posterior aspect of the S2 segment, which could potentially affect the adjacent right sacral nerve roots. No other significant extraosseous extension of tumor. Probable pathologic fracture involving L2 vertebral body with mild 10% height loss without bony retropulsion.Findings consistent with widespread osseous metastases involving the thoracic spine. No significant extra osseous or epidural tumor at this time. No evidence for cord compression or severe canal Stenosis. Pathologic fracture involving the T4 vertebral body with associated 50% height loss without bony retropulsion.      04/29/2016 Initial Biopsy    Bronchoscopy with brushings, needle aspirations, and biopsies. With Dr. Roxan Hockey      04/30/2016 Imaging    MRI brain Over 80 cerebral and infratentorial brain metastases. No vasogenic edema. 2. Tiny cortical infarct in the right frontal lobe. 3. 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      04/30/2016 Pathology Results    Adenocarcinoma       HISTORY OF PRESENT ILLNESS:   Christina Estes is a 81 y.o. female with a medical history significant for DM, HTN, GERD, PUD who is referred to the [facility] following discharge from [facility] on 04/22/2016 with CT  imaging demonstrating a 5 cm spiculated mass in the left lower lobe with upper thoracic vertebral body lucency with mild compression deformity concerning for pathologic fracture. Final diagnosis is stage IV adenocarcinoma of the lung. Details noted above in onchx.   Christina Estes is accompanied by her husband and son. The patient presents in a wheelchair. She is at the [facility]. I personally reviewed and went over laboratory studies with the patient. I spoke with the patient and her family about biomarker testing in determining future treatment plans. They are open to hospice, they are also interested in biomarker testing however.   She has not met with a radiation oncologist.   She can tell when she has to urinate now, she still has a foley in place.   She reports some pain in her right chest area. She reports nausea.   Mostly she just feels terrible and does not have much of an appetite. She does not like the food offered at the [facility]. She notes that she can eat much better if she likes the food. She does not feel like she needs to be at the [facility]. She is not sure who admitted her there but she would like to go home. Her husband states she is not checked on often at the [facility] and they do not turn her so she has developed sores on her bottom. The patient reports the bed sores are not terrible, but she does have mild skin break down.  Pain is ok. It is improved on pain medication.    Review  of Systems  Constitutional: Positive for weight loss (25 lbs since 04/30/16). Negative for chills and fever.       Appetite loss  HENT: Negative.   Eyes: Negative.  Negative for blurred vision and double vision.  Respiratory: Negative.  Negative for cough, hemoptysis, sputum production, shortness of breath and wheezing.   Cardiovascular: Positive for chest pain. Negative for leg swelling.       Right chest area pain  Gastrointestinal: Positive for nausea. Negative for abdominal pain  and vomiting.  Genitourinary: Negative.   Musculoskeletal: Positive for back pain.  Skin: Negative.   Neurological: Positive for weakness. Negative for dizziness, sensory change, speech change, focal weakness, seizures, loss of consciousness and headaches.  Endo/Heme/Allergies: Negative.   Psychiatric/Behavioral: Negative.   14 point review of systems was performed and is negative except as detailed under history of present illness and above    PAST MEDICAL HISTORY:   Past Medical History:  Diagnosis Date  . Anxiety   . Coronary artery disease   . Depression   . Diabetes mellitus without complication ([place])   . DOE (dyspnea on exertion)   . GERD (gastroesophageal reflux disease)   . Hypercholesteremia   . Hypertension   . Lung mass 04/22/2016  . Obesity     ALLERGIES: Allergies  Allergen Reactions  . No Known Allergies       MEDICATIONS: I have reviewed the patient's current medications.    Current Outpatient Prescriptions on File Prior to Visit  Medication Sig Dispense Refill  . ALPRAZolam (XANAX) 1 MG tablet Take 1 tablet (1 mg total) by mouth at bedtime as needed for anxiety. 30 tablet 0  . atorvastatin (LIPITOR) 40 MG tablet Take 40 mg by mouth daily.    . carvedilol (COREG) 6.25 MG tablet Take 6.25 mg by mouth 2 (two) times daily with a meal.    . cyclobenzaprine (FLEXERIL) 10 MG tablet Take 1 tablet (10 mg total) by mouth 3 (three) times daily as needed for muscle spasms. 30 tablet 0  . esomeprazole (NEXIUM) 40 MG capsule     . fentaNYL (DURAGESIC - DOSED MCG/HR) 12 MCG/HR Place 1 patch (12.5 mcg total) onto the skin every 3 (three) days. 5 patch 0  . gabapentin (NEURONTIN) 400 MG capsule Take 400 mg by mouth at bedtime.    Marland Kitchen losartan-hydrochlorothiazide (HYZAAR) 100-25 MG tablet Take 1 tablet by mouth daily.    . metFORMIN (GLUCOPHAGE) 500 MG tablet Take 500 mg by mouth 2 (two) times daily with a meal.     . oxyCODONE (OXY IR/ROXICODONE) 5 MG immediate release tablet  Take 1 tablet (5 mg total) by mouth every 4 (four) hours as needed for severe pain. 30 tablet 0  . traMADol (ULTRAM) 50 MG tablet Take 1 tablet (50 mg total) by mouth every 6 (six) hours as needed for moderate pain. 30 tablet 0  . vitamin B-12 (CYANOCOBALAMIN) 1000 MCG tablet Take 1,000 mcg by mouth daily.     No current facility-administered medications on file prior to visit.      PAST SURGICAL HISTORY Past Surgical History:  Procedure Laterality Date  . CARDIAC CATHETERIZATION  12/11/2003  . CHOLECYSTECTOMY    . VIDEO BRONCHOSCOPY N/A 04/30/2016   Procedure: VIDEO BRONCHOSCOPY;  Surgeon: [name], MD;  Location: [place];  Service: Thoracic;  Laterality: N/A;    FAMILY HISTORY: Family History  Problem Relation Age of Onset  . Cancer Mother   . Diabetes Mellitus II Son   .  Cancer Brother    Mother deceased at age of 77 from "old age." Father deceased at age of 41 from [place].   1 brother deceased at age of 83 from prostate cancer. 7 children, 6 living. 2 grandchildren and 3 great-grandchildren.  SOCIAL HISTORY: NEVER smoker.  NEVER drinker.  No illicit drug abuse.  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  [employer].  Married.  Social History   Social History  . Marital status: Married    Spouse name: N/A  . Number of children: N/A  . Years of education: N/A   Social History Main Topics  . Smoking status: Never Smoker  . Smokeless tobacco: Never Used  . Alcohol use No  . Drug use: Unknown  . Sexual activity: Not Asked   Other Topics Concern  . None   Social History Narrative  . None    PERFORMANCE STATUS: The patient's performance status is 2 - Symptomatic, <50% confined to bed  PHYSICAL EXAM: Most Recent Vital Signs: Blood pressure (!) 113/45, pulse 70, temperature 97.6 F (36.4 C), temperature source Oral, resp. rate 16, weight 205 lb (93 kg), SpO2 95 %. General appearance: alert, cooperative, appears stated age, no distress, moderately obese, pale  and in wheelchair and accompanied by husband and son Head: Normocephalic, without obvious abnormality, atraumatic Eyes: negative findings: lids and lashes normal, conjunctivae and sclerae normal and corneas clear Throat: normal findings: lips normal without lesions, buccal mucosa normal and oropharynx pink & moist without lesions or evidence of thrush Neck: no adenopathy and supple, symmetrical, trachea midline Lungs: clear to auscultation bilaterally Heart: regular rate and rhythm, S1, S2 normal, no murmur, click, rub or gallop Abdomen: normal findings: no masses palpable, no organomegaly and soft, non-tender and abnormal findings:  obese Extremities: extremities normal, atraumatic, no cyanosis edema confined to the feet and ankles. Moves LE without difficulty. Did not ambulate today. Did stand for examination. Skin: Skin color, texture, turgor normal. No rashes or lesions or left lateral upper arm healed surgical site. Grade 1, early Grade 2, skin breakdown present on her bilateral buttocks.  Lymph nodes: Cervical, supraclavicular, and axillary nodes normal. Neurologic: Grossly normal   LABORATORY DATA:        RADIOGRAPHY:  CT SCAN FROM [place]:                PATHOLOGY:  N/A  ASSESSMENT/PLAN:  Stage IV adenocarcinoma lung Brain metastases NEVER smoker Pathologic FX L2 Osseous metastatic disease Marginal PS Weight loss Urinary retention Grade 1 decubiti, buttocks Discussion about end of life planning   Labs reviewed. Hospitalization reviewed. Imaging reviewed. We had a very frank discussion today. They understand she has stage IV incurable adenocarcinoma of the lung. They are not unwilling to consider hospice. They are however interested in molecular markers, specifically given her nonsmoking status EGFR and ALK. I do not feel this is unreasonable, as I have had a few patients respond dramatically to targeted therapy. There was not enough tissue for  molecular testing, we have sent GUARDANT testing today. I advised them that it would take two weeks.   Weight loss of 25 lbs since 04/30/16.I have written her a prescription for an appetite stimulant. Will refer her to nutrition.   While the Guardant results are pending, I recommended the patient meet with radiation oncology for consideration of whole brain radiation therapy. I have referred the patient to radiation oncology. They would like to meet with them and discuss WBRT and potential palliative RT to the spine.  We also spoke in detail about hospice.  The patient is not an ideal candidate for traditional chemotherapy and they understand this. .  She has Grade 1, early Grade 2, skin breakdown present bilateral buttocks, I will send a note to the [facility] about this. She obviously needs wound care.   She will return for follow up in 2 weeks to discuss the results of Guardant testing. At which time final planning will be made either a trial of targeted therapy pending Guardant results vs. Hospice referral.  MEDICATIONS PRESCRIBED THIS ENCOUNTER: Meds ordered this encounter  Medications  . dronabinol (MARINOL) 2.5 MG capsule    Sig: Take 1 capsule (2.5 mg total) by mouth 2 (two) times daily before a meal.    Dispense:  60 capsule    Refill:  3   All questions were answered. The patient knows to call the clinic with any problems, questions or concerns. We can certainly see the patient much sooner if necessary.  This document serves as a record of services personally performed by Ancil Linsey, MD. It was created on her behalf by Arlyce Harman, a trained medical scribe. The creation of this record is based on the scribe's personal observations and the provider's statements to them. This document has been checked and approved by the attending provider.  I have reviewed the above documentation for accuracy and completeness and I agree with the above.  This note is electronically  signed [email or web address] Cyril Mourning, MD  05/12/2016 10:31 AM

## 2016-05-07 NOTE — Patient Instructions (Addendum)
[place] at [facility] Discharge Instructions  RECOMMENDATIONS MADE BY THE CONSULTANT AND ANY TEST RESULTS WILL BE SENT TO YOUR REFERRING PHYSICIAN.  You were seen by Dr. Whitney Muse today. Return in 2 weeks for follow up. You will be referred to Radiation Oncology in [place].    Thank you for choosing [place] at [facility] to provide your oncology and hematology care.  To afford each patient quality time with our provider, please arrive at least 15 minutes before your scheduled appointment time.   Beginning January 23rd 2017 lab work for the [facility] will be done in the  Main lab at [facility] on 1st floor. If you have a lab appointment with the [facility] please come in thru the  Main Entrance and check in at the main information desk  You need to re-schedule your appointment should you arrive 10 or more minutes late.  We strive to give you quality time with our providers, and arriving late affects you and other patients whose appointments are after yours.  Also, if you no show three or more times for appointments you may be dismissed from the clinic at the providers discretion.     Again, thank you for choosing [facility].  Our hope is that these requests will decrease the amount of time that you wait before being seen by our physicians.       _____________________________________________________________  Should you have questions after your visit to [facility], please contact our office at [phone] between the hours of 8:30 a.m. and 4:30 p.m.  Voicemails left after 4:30 p.m. will not be returned until the following business day.  For prescription refill requests, have your pharmacy contact our office.         Resources For Cancer Patients and their Caregivers ? American Cancer Society: Can assist with transportation, wigs, general needs, runs Look Good Feel Better.         520 149 6292 ? Cancer Care: Provides financial assistance, online support groups, medication/co-pay assistance.  1-800-813-HOPE 740-565-1134) ? [place] Assists [place] cancer patients and their families through emotional , educational and financial support.  949-048-1060 ? [place] DSS Where to apply for food stamps, Medicaid and utility assistance. 989-182-3246 ? [facility]: Transportation to medical appointments. 680-277-5464 ? Social Security Administration: May apply for disability if have a Stage IV cancer. 940-038-2536 619-740-0061 ? [employer], Disability and Transit Services: Assists with nutrition, care and transit needs. [place] Support Programs: '@10RELATIVEDAYS'$ @ > Cancer Support Group  2nd Tuesday of the month 1pm-2pm, Journey Room  > Creative Journey  3rd Tuesday of the month 1130am-1pm, Journey Room  > Look Good Feel Better  1st Wednesday of the month 10am-12 noon, Journey Room (Call [place] to register [phone])

## 2016-05-07 NOTE — Progress Notes (Unsigned)
Referral sent to [facility].  Records faxed on 9/22.

## 2016-05-11 ENCOUNTER — Encounter (HOSPITAL_COMMUNITY): Payer: Self-pay | Admitting: Hematology & Oncology

## 2016-05-11 DIAGNOSIS — C7951 Secondary malignant neoplasm of bone: Secondary | ICD-10-CM | POA: Insufficient documentation

## 2016-05-12 DIAGNOSIS — R634 Abnormal weight loss: Secondary | ICD-10-CM | POA: Insufficient documentation

## 2016-05-12 DIAGNOSIS — Z7189 Other specified counseling: Secondary | ICD-10-CM | POA: Insufficient documentation

## 2016-05-12 DIAGNOSIS — L8990 Pressure ulcer of unspecified site, unspecified stage: Secondary | ICD-10-CM | POA: Insufficient documentation

## 2016-05-13 ENCOUNTER — Encounter (HOSPITAL_COMMUNITY): Payer: Self-pay

## 2016-05-13 ENCOUNTER — Observation Stay (HOSPITAL_COMMUNITY)
Admission: EM | Admit: 2016-05-13 | Discharge: 2016-05-15 | Disposition: A | Discharge status: Skilled Nursing Facility | Payer: Commercial Managed Care - HMO | Attending: Oncology | Admitting: Oncology

## 2016-05-13 ENCOUNTER — Other Ambulatory Visit: Payer: Self-pay

## 2016-05-13 ENCOUNTER — Telehealth (HOSPITAL_COMMUNITY): Payer: Self-pay | Admitting: *Deleted

## 2016-05-13 ENCOUNTER — Emergency Department (HOSPITAL_COMMUNITY): Payer: Commercial Managed Care - HMO

## 2016-05-13 DIAGNOSIS — Z66 Do not resuscitate: Secondary | ICD-10-CM | POA: Insufficient documentation

## 2016-05-13 DIAGNOSIS — N179 Acute kidney failure, unspecified: Secondary | ICD-10-CM | POA: Diagnosis present

## 2016-05-13 DIAGNOSIS — C7931 Secondary malignant neoplasm of brain: Secondary | ICD-10-CM | POA: Insufficient documentation

## 2016-05-13 DIAGNOSIS — I2510 Atherosclerotic heart disease of native coronary artery without angina pectoris: Secondary | ICD-10-CM | POA: Diagnosis not present

## 2016-05-13 DIAGNOSIS — C3490 Malignant neoplasm of unspecified part of unspecified bronchus or lung: Secondary | ICD-10-CM | POA: Diagnosis not present

## 2016-05-13 DIAGNOSIS — E43 Unspecified severe protein-calorie malnutrition: Secondary | ICD-10-CM | POA: Diagnosis not present

## 2016-05-13 DIAGNOSIS — Z7984 Long term (current) use of oral hypoglycemic drugs: Secondary | ICD-10-CM | POA: Insufficient documentation

## 2016-05-13 DIAGNOSIS — I959 Hypotension, unspecified: Secondary | ICD-10-CM | POA: Diagnosis present

## 2016-05-13 DIAGNOSIS — I10 Essential (primary) hypertension: Secondary | ICD-10-CM | POA: Diagnosis not present

## 2016-05-13 DIAGNOSIS — R571 Hypovolemic shock: Secondary | ICD-10-CM | POA: Diagnosis not present

## 2016-05-13 DIAGNOSIS — K219 Gastro-esophageal reflux disease without esophagitis: Secondary | ICD-10-CM | POA: Insufficient documentation

## 2016-05-13 DIAGNOSIS — E119 Type 2 diabetes mellitus without complications: Secondary | ICD-10-CM | POA: Diagnosis not present

## 2016-05-13 DIAGNOSIS — C787 Secondary malignant neoplasm of liver and intrahepatic bile duct: Secondary | ICD-10-CM | POA: Diagnosis not present

## 2016-05-13 DIAGNOSIS — E875 Hyperkalemia: Secondary | ICD-10-CM | POA: Diagnosis present

## 2016-05-13 DIAGNOSIS — C779 Secondary and unspecified malignant neoplasm of lymph node, unspecified: Secondary | ICD-10-CM | POA: Insufficient documentation

## 2016-05-13 DIAGNOSIS — E871 Hypo-osmolality and hyponatremia: Secondary | ICD-10-CM | POA: Diagnosis present

## 2016-05-13 DIAGNOSIS — R29898 Other symptoms and signs involving the musculoskeletal system: Secondary | ICD-10-CM | POA: Diagnosis present

## 2016-05-13 DIAGNOSIS — N390 Urinary tract infection, site not specified: Secondary | ICD-10-CM | POA: Diagnosis not present

## 2016-05-13 DIAGNOSIS — Z79899 Other long term (current) drug therapy: Secondary | ICD-10-CM | POA: Insufficient documentation

## 2016-05-13 DIAGNOSIS — C7951 Secondary malignant neoplasm of bone: Secondary | ICD-10-CM | POA: Diagnosis not present

## 2016-05-13 DIAGNOSIS — Z6831 Body mass index (BMI) 31.0-31.9, adult: Secondary | ICD-10-CM | POA: Diagnosis not present

## 2016-05-13 DIAGNOSIS — L89151 Pressure ulcer of sacral region, stage 1: Secondary | ICD-10-CM | POA: Insufficient documentation

## 2016-05-13 DIAGNOSIS — E669 Obesity, unspecified: Secondary | ICD-10-CM | POA: Insufficient documentation

## 2016-05-13 DIAGNOSIS — L8990 Pressure ulcer of unspecified site, unspecified stage: Secondary | ICD-10-CM | POA: Diagnosis present

## 2016-05-13 DIAGNOSIS — F419 Anxiety disorder, unspecified: Secondary | ICD-10-CM | POA: Insufficient documentation

## 2016-05-13 DIAGNOSIS — E860 Dehydration: Secondary | ICD-10-CM | POA: Diagnosis present

## 2016-05-13 HISTORY — DX: Malignant melanoma of unspecified upper limb, including shoulder: C43.60

## 2016-05-13 HISTORY — DX: Other specified postprocedural states: R11.2

## 2016-05-13 HISTORY — DX: Nausea with vomiting, unspecified: Z98.890

## 2016-05-13 HISTORY — DX: Other chronic pain: G89.29

## 2016-05-13 HISTORY — DX: Dorsalgia, unspecified: M54.9

## 2016-05-13 HISTORY — DX: Personal history of other diseases of the digestive system: Z87.19

## 2016-05-13 HISTORY — DX: Malignant (primary) neoplasm, unspecified: C80.1

## 2016-05-13 HISTORY — DX: Basal cell carcinoma of skin, unspecified: C44.91

## 2016-05-13 HISTORY — DX: Personal history of other medical treatment: Z92.89

## 2016-05-13 HISTORY — DX: Type 2 diabetes mellitus without complications: E11.9

## 2016-05-13 HISTORY — DX: Unspecified osteoarthritis, unspecified site: M19.90

## 2016-05-13 LAB — COMPREHENSIVE METABOLIC PANEL
ALBUMIN: 3.8 g/dL (ref 3.5–5.0)
ALK PHOS: 230 U/L — AB (ref 38–126)
ALT: 34 U/L (ref 14–54)
AST: 39 U/L (ref 15–41)
Anion gap: 14 (ref 5–15)
BUN: 42 mg/dL — AB (ref 6–20)
CALCIUM: 9.9 mg/dL (ref 8.9–10.3)
CO2: 22 mmol/L (ref 22–32)
CREATININE: 2.98 mg/dL — AB (ref 0.44–1.00)
Chloride: 99 mmol/L — ABNORMAL LOW (ref 101–111)
GFR calc non Af Amer: 14 mL/min — ABNORMAL LOW (ref >60)
GFR, EST AFRICAN AMERICAN: 16 mL/min — AB (ref >60)
GLUCOSE: 135 mg/dL — AB (ref 65–99)
Potassium: 5.1 mmol/L (ref 3.5–5.1)
SODIUM: 135 mmol/L (ref 135–145)
Total Bilirubin: 0.6 mg/dL (ref 0.3–1.2)
Total Protein: 7.6 g/dL (ref 6.5–8.1)

## 2016-05-13 LAB — URINE MICROSCOPIC-ADD ON

## 2016-05-13 LAB — CBC WITH DIFFERENTIAL/PLATELET
BASOS PCT: 0 %
Basophils Absolute: 0 10*3/uL (ref 0.0–0.1)
EOS ABS: 0.1 10*3/uL (ref 0.0–0.7)
EOS PCT: 1 %
HCT: 38.6 % (ref 36.0–46.0)
Hemoglobin: 12.5 g/dL (ref 12.0–15.0)
Lymphocytes Relative: 23 %
Lymphs Abs: 2.7 10*3/uL (ref 0.7–4.0)
MCH: 27.2 pg (ref 26.0–34.0)
MCHC: 32.4 g/dL (ref 30.0–36.0)
MCV: 83.9 fL (ref 78.0–100.0)
MONO ABS: 0.8 10*3/uL (ref 0.1–1.0)
MONOS PCT: 7 %
Neutro Abs: 7.9 10*3/uL — ABNORMAL HIGH (ref 1.7–7.7)
Neutrophils Relative %: 69 %
Platelets: 356 10*3/uL (ref 150–400)
RBC: 4.6 MIL/uL (ref 3.87–5.11)
RDW: 15.8 % — AB (ref 11.5–15.5)
WBC: 11.5 10*3/uL — ABNORMAL HIGH (ref 4.0–10.5)

## 2016-05-13 LAB — I-STAT CG4 LACTIC ACID, ED
LACTIC ACID, VENOUS: 3.58 mmol/L — AB (ref 0.5–1.9)
Lactic Acid, Venous: 3.05 mmol/L (ref 0.5–1.9)

## 2016-05-13 LAB — URINALYSIS, ROUTINE W REFLEX MICROSCOPIC
BILIRUBIN URINE: NEGATIVE
Glucose, UA: NEGATIVE mg/dL
KETONES UR: NEGATIVE mg/dL
NITRITE: NEGATIVE
Protein, ur: 30 mg/dL — AB
Specific Gravity, Urine: 1.01 (ref 1.005–1.030)
pH: 6 (ref 5.0–8.0)

## 2016-05-13 LAB — LACTIC ACID, PLASMA: Lactic Acid, Venous: 1.9 mmol/L (ref 0.5–1.9)

## 2016-05-13 MED ORDER — SENNOSIDES-DOCUSATE SODIUM 8.6-50 MG PO TABS: 1.0000 | ORAL_TABLET | Freq: Every evening | ORAL | Status: DC | PRN

## 2016-05-13 MED ORDER — FENTANYL 12 MCG/HR TD PT72
12.5000 ug | MEDICATED_PATCH | TRANSDERMAL | Status: DC
  Administered 2016-05-13: 12.5 ug via TRANSDERMAL
  Filled 2016-05-13: qty 1

## 2016-05-13 MED ORDER — TRAMADOL HCL 50 MG PO TABS: 50.0000 mg | ORAL_TABLET | Freq: Four times a day (QID) | ORAL | Status: DC | PRN

## 2016-05-13 MED ORDER — LACTATED RINGERS IV BOLUS (SEPSIS)
1000.0000 mL | Freq: Once | INTRAVENOUS | Status: AC
  Administered 2016-05-13: 1000 mL via INTRAVENOUS

## 2016-05-13 MED ORDER — DEXTROSE 5 % IV SOLN
1.0000 g | Freq: Every day | INTRAVENOUS | Status: DC
  Administered 2016-05-14 (×2): 1 g via INTRAVENOUS
  Filled 2016-05-13 (×4): qty 10

## 2016-05-13 MED ORDER — DRONABINOL 2.5 MG PO CAPS
2.5000 mg | ORAL_CAPSULE | Freq: Two times a day (BID) | ORAL | Status: DC
  Administered 2016-05-13 – 2016-05-15 (×4): 2.5 mg via ORAL
  Filled 2016-05-13 (×4): qty 1

## 2016-05-13 MED ORDER — SODIUM CHLORIDE 0.9 % IV BOLUS (SEPSIS)
1000.0000 mL | Freq: Once | INTRAVENOUS | Status: AC
  Administered 2016-05-13: 1000 mL via INTRAVENOUS

## 2016-05-13 MED ORDER — PANTOPRAZOLE SODIUM 40 MG PO TBEC
40.0000 mg | DELAYED_RELEASE_TABLET | Freq: Every day | ORAL | Status: DC
  Administered 2016-05-13 – 2016-05-15 (×3): 40 mg via ORAL
  Filled 2016-05-13 (×3): qty 1

## 2016-05-13 MED ORDER — OXYCODONE HCL 5 MG PO TABS
5.0000 mg | ORAL_TABLET | ORAL | Status: DC | PRN
  Administered 2016-05-14 – 2016-05-15 (×2): 5 mg via ORAL
  Filled 2016-05-13 (×2): qty 1

## 2016-05-13 MED ORDER — VITAMIN B-12 1000 MCG PO TABS
1000.0000 ug | ORAL_TABLET | Freq: Every day | ORAL | Status: DC
  Administered 2016-05-13 – 2016-05-15 (×3): 1000 ug via ORAL
  Filled 2016-05-13 (×4): qty 1

## 2016-05-13 MED ORDER — GABAPENTIN 400 MG PO CAPS
400.0000 mg | ORAL_CAPSULE | Freq: Every day | ORAL | Status: DC
  Administered 2016-05-13 – 2016-05-14 (×2): 400 mg via ORAL
  Filled 2016-05-13 (×2): qty 1

## 2016-05-13 MED ORDER — HEPARIN SODIUM (PORCINE) 5000 UNIT/ML IJ SOLN
5000.0000 [IU] | Freq: Three times a day (TID) | INTRAMUSCULAR | Status: DC
Start: 2016-05-13 — End: 2016-05-15
  Administered 2016-05-13 – 2016-05-15 (×5): 5000 [IU] via SUBCUTANEOUS
  Filled 2016-05-13 (×5): qty 1

## 2016-05-13 MED ORDER — PROMETHAZINE HCL 25 MG PO TABS: 25.0000 mg | ORAL_TABLET | ORAL | Status: DC | PRN

## 2016-05-13 MED ORDER — OMEPRAZOLE MAGNESIUM 20 MG PO TBEC: 20.0000 mg | DELAYED_RELEASE_TABLET | Freq: Every day | ORAL | Status: DC

## 2016-05-13 MED ORDER — SODIUM CHLORIDE 0.9 % IV SOLN
INTRAVENOUS | Status: AC
  Administered 2016-05-13: 18:00:00 via INTRAVENOUS

## 2016-05-13 MED ORDER — ALBUTEROL SULFATE (2.5 MG/3ML) 0.083% IN NEBU: 2.5000 mg | INHALATION_SOLUTION | RESPIRATORY_TRACT | Status: DC | PRN

## 2016-05-13 MED ORDER — ALPRAZOLAM 0.5 MG PO TABS: 1.0000 mg | ORAL_TABLET | Freq: Every evening | ORAL | Status: DC | PRN

## 2016-05-13 NOTE — H&P (Signed)
Date: 05/13/2016               Patient Name:  Christina Estes MRN: [id number]  DOB: 08-18-1934 Age / Sex: 81 y.o., female   PCP: [name], MD         Medical Service: Internal Medicine Teaching Service         Attending Physician: Dr. Annia Belt, MD    First Contact: Dr. Heber Amasa Pager: [phone]  Second Contact: Dr. 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    This morning, patient woke up at her living facility to get ready for her Urologist appointment; however, she was having a difficult time getting ready because of fatigue. With time she was able to get out of bed, ambulate a few feet to her wheelchair and get out of her wheelchair to get in the car.  Once she arrived to her appointment, the nurses found her blood pressure to be 70/40s. Patient denied lightheadedness, but continued to feel fatigued.  Yesterday, patient underwent her first radiation treatment to her brain and spine for metastatic lung cancer. After her appointment, patient became nauseous with several episodes of vomiting. She denies recurrent nausea or vomiting; however, she has persistent decreased appetite and has a lost 40 pounds in the last 2 months.   She does admit to dysuria for the past 1 week.  She has had a foley catheter since discharge and was at the urology office today for possible removal.  She denies lightheadedness, shortness of breath, dizziness, or chest pain,  cough, or fever. She does however admit to chills.   She was recently admitted earlier this month with complaints of increasing back pain where she was found to have osseous metastasis to her thoracic and lumbar spine from her lung cancer. During that admission MRI showed extensive brain metastasis as well.  She had guardant testing to check for molecular markers on 9/22 at her oncology appointment and results are pending. She is not a candidate for traditional chemotherapy.  Patient is receiving whole brain radiation therapy and awaiting results of her Guardant testing to determine if they will try targeted therapy versus pursuing hospice.   Meds:  Current Meds  Medication Sig  . albuterol (PROVENTIL) (2.5 MG/3ML) 0.083% nebulizer solution Take 2.5 mg by nebulization every 4 (four) hours as needed for wheezing.  Marland Kitchen ALPRAZolam (XANAX) 1 MG tablet Take 1 tablet (1 mg total) by mouth at bedtime as needed for anxiety.  . carvedilol (COREG) 6.25 MG tablet Take 6.25 mg by mouth 2 (two) times daily with a meal.  . cyclobenzaprine (FLEXERIL) 10 MG tablet Take 1 tablet (10 mg total) by mouth 3 (three) times daily as needed for muscle spasms. (Patient taking differently: Take 10 mg by mouth every 8 (eight) hours as needed for muscle spasms. )  . dronabinol (MARINOL) 2.5 MG capsule Take 1 capsule (2.5 mg total) by mouth 2 (two) times daily before  a meal.  . fentaNYL (DURAGESIC - DOSED MCG/HR) 12 MCG/HR Place 1 patch (12.5 mcg total) onto the skin every 3 (three) days.  Marland Kitchen gabapentin (NEURONTIN) 400 MG capsule Take 400 mg by mouth at bedtime.  Marland Kitchen losartan-hydrochlorothiazide (HYZAAR) 100-25 MG tablet Take 1 tablet by mouth daily.  . metFORMIN (GLUMETZA) 1000 MG (MOD) 24 hr tablet Take 1,000 mg by mouth daily with breakfast.  . omeprazole (PRILOSEC OTC) 20 MG tablet Take 20 mg by mouth daily.  Marland Kitchen oxyCODONE (OXY IR/ROXICODONE) 5 MG immediate release tablet Take 1 tablet (5 mg total) by mouth every 4 (four) hours as  needed for severe pain.  . promethazine (PHENERGAN) 25 MG tablet Take 25 mg by mouth every 4 (four) hours as needed for nausea or vomiting (4 dose maximum in 24 hours).  . traMADol (ULTRAM) 50 MG tablet Take 1 tablet (50 mg total) by mouth every 6 (six) hours as needed for moderate pain.  . vitamin B-12 (CYANOCOBALAMIN) 1000 MCG tablet Take 1,000 mcg by mouth daily.    Allergies: Allergies as of 05/13/2016 - Review Complete 05/13/2016  Allergen Reaction Noted  . No known allergies  04/29/2016   Past Medical History:  Diagnosis Date  . Anxiety   . Coronary artery disease   . Depression   . Diabetes mellitus without complication ([place])   . DOE (dyspnea on exertion)   . GERD (gastroesophageal reflux disease)   . Hypercholesteremia   . Hypertension   . Lung mass 04/22/2016  . Obesity     Family History:  Father: Deceased at age of 5 from myocardial infarction Brother: Prostate cancer 7 children, 6 living  Social History:  Tobacco use: Denies, never smoker Alcohol use: Denies Illicit drug use: Denies, she is prescribed medical marijuana Lives in [place], [place] with her husband and dog.  Review of Systems: A complete ROS was negative except as per HPI.   Physical Exam: Vitals:   05/13/16 1506 05/13/16 1515 05/13/16 1545 05/13/16 1625  BP: (!) 113/50 135/94 114/62 (!) 120/47  Pulse: 85 82 81 78  Resp: 17 18 24  (!) 22  Temp:    97.8 F (36.6 C)  TempSrc:    Oral  SpO2: 96% 98% 98% 96%  Weight:    212 lb 4.9 oz (96.3 kg)  Height:    5' 8"  (1.727 m)   General: Vital signs reviewed.  Patient is well-developed, in no acute distress and cooperative with exam.  Head: Normocephalic and atraumatic. Eyes: EOMI, conjunctivae normal, no scleral icterus.  Neck: Supple, trachea midline, normal ROM, no JVD.  Cardiovascular: RRR, S1 normal, S2 normal, no murmurs, gallops, or rubs. Pulmonary/Chest: Clear to auscultation bilaterally, no wheezes, rales, or rhonchi. Abdominal: Soft,  non-tender, non-distended, no masses, organomegaly, or guarding present.  Musculoskeletal: No joint deformities, erythema, or stiffness, ROM full and nontender. Extremities: No lower extremity edema bilaterally,  pulses symmetric and intact bilaterally. No cyanosis or clubbing. Neurological: A&O x3, Strength is normal and symmetric bilaterally, no focal motor deficit, sensory intact to light touch bilaterally.  Skin: Warm, dry and intact. Stage I decubitus ulcer. Psychiatric: Normal mood and affect. speech and behavior is normal. Cognition and memory are normal.   EKG: Prolonged PR interval, sinus rhythm  CXR:  FINDINGS: Heart size mildly enlarged.  Negative for heart failure. Left lower lobe mass density unchanged from the CT. Negative for pneumonia.  No pleural effusion.  IMPRESSION: Left lower lobe mass lesion. Negative for heart failure or pneumonia.  Assessment &  Plan by Problem: Active Problems:   Hypovolemic shock ([place])  Ms. 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    Hypotension Patient came into the ED with a blood pressure of 70/23. Patient has had poor oral intake and several vomiting episodes after radiation to her brain. She received 2 L of lactated Ringer bolus and 2 L normal saline bolus in the ED.  Her pressures have improved to 113/50.  Will be admitted inpatient to rule out infectious causes and observe blood pressure status. Her chest x-ray shows no signs of infection. We will obtain a UA. - 170m/hr IV NS - Urine analysis - Urine culture  Acute kidney injury Creatinine 2.98, baseline appears to be 1.3.  Her acute kidney injury is  likely due to poor oral intake along with increased vomiting episodes yesterday.  She was given 4 L of fluids in the ED. - Bmet in the morning - 1012mhr IV NS     Urinary retention: This has been an issue since previous admission this month. Her Urinary retention symptoms on her previous admission was thought to be most likely secondary to constipation and narcotic medications. She was discharged with a Foley catheter and scheduled to follow-up with urology however, [facility] of her urology appointment. -Discontinue foley and voiding trial  - Continue foley if urinary retention occurs  Lung cancer with metastasis to lumbar/thoracic spine and brain She was recently diagnosed with a 5 cm spiculated lung mass in her left lower lobe and has been following with oncology outpatient. MRI thoracic and lumbar spine showed widespread osseous metastasis. On 9/15 bronchoscopy was performed for lung mass biopsy and showed Stage IV non small cell carcinoma. MRI brain showed extensive brain metastases. Pain controlled with fentanyl 12.5 MCG patch, gabapentin 400 mg at bedtime, and oxycodone immediate release 5 mg every 4 hours when necessary. - Continue home pain medications  Weightloss Patient has had 25 pound weight loss 04/30/16 -Nutrition consult  Pressure ulcer Patient has grade 1 pressure ulcer. -Wound care consult   DVT/PE ppx: Heparin CODE: Full FEN: Regular  Dispo: Admit patient to Observation with expected length of stay less than 2 midnights.  Signed: JeValinda PartyDO 05/13/2016, 4:38 PM  Pager: [phone] attending admission note: I personally interviewed and examined this patient and reviewed all pertinent [place], and x-ray data including review of current and outside films with one of our radiologists and I attest to the accuracy of the evaluation and management plan as recorded by resident physician Dr. JeKalman Shanxcept  for any changes that might be highlighted below.  Clinical summary: [facility] [age][facility] with increasing back pain and was found to have multiple lytic lesions of her spine and a 5 cm left lower lobe lung mass. She is a never smoker. Staging evaluation was initiated. No additional lung lesions seen on CT scan of the chest. MRI of the brain showed multiple cerebral and cerebellar metastases. MRI of the lumbar and thoracic spine also confirmed widespread metastases. There was a pathologic compression fracture of the T4 vertebral body without  any retropulsion. Extraosseous extension of tumor at the right posterior aspect of S2. Probable pathologic fracture at L2 vertebral body. In addition, multi-level degenerative arthritis. No evidence for cord compression despite her presentation with lower abdominal pain and urinary retention for which she was admitted here on September 11. On September 16 she underwent bronchoscopy with biopsy with tissue obtained diagnostic for adenocarcinoma. She is been seen in consultation by medical and radiation oncology. She just started radiation to the spine and brain yesterday. Special studies to look at EGFR overexpression and ALK mutations are pending.  She went to see the urologist yesterday for reassessment and potential removal of her Foley catheter. She complained of generalized weakness. She was noted to be hypotensive with blood pressure 70 systolic and referred to the emergency department for further evaluation.  Current exam:Blood pressure (!) 133/42, pulse 67, temperature 98.3 F (36.8 C), temperature source Oral, resp. rate 17, height 5' 8"  (1.727 m), weight 212 lb 4.9 oz (96.3 kg), SpO2 98 %. Elderly woman in no acute distress. She is awake, alert, oriented 3. Pertinent findings limited to the neurologic exam which show 3+ over 5 weakness of the left lower extremity with only partial ability to  straight leg raise on that side. I've over 5 distal strength but 3/5 proximal strength of the hip flexors. Reflexes are absent but symmetric bilaterally at the patellae, 1+ symmetric at the biceps. Cranial nerves grossly normal. Pupils equal round reactive to light. Full extraocular movements. No facial asymmetry. Tongue midline. Upper body coordination slow and deliberate finger-to-finger exam but accurate. Gait not tested. The abdomen is soft. Nontender. No bladder distention. The Foley catheter has been removed.  Pertinent lab: Sodium 135, potassium 5.1 Bicarbonate 22, BUN 42, creatinine 3.0 with recorded values on September 11 of BUN 56 creatinine 4.5, some improvement after Foley catheter placed with fall in BUN to 38, creatinine 1.2 on September 22. Lactic acid 3.05, 3.58, 1.9 Alkaline phosphatase 230, calcium 9.9, albumin 3.8 White blood count 11,500 with 69% neutrophils no change from recent baseline Urinalysis: Large positive for leukocytes, many bacteria, white cells too numerous to count  Impression: Hypertension in a lady with recently diagnosed widely metastatic adenocarcinoma of the lung. Multiple potential etiologies including urinary tract infection, possible adrenal metastases given the low sodium and elevated potassium, contributions from decreased oral intake, and possible contribution from a brainstem metastasis seen on recent MRI. She was started on Rocephin to cover the urinary tract infection. Gentle IV hydration. We will check cortisol levels. Goal is to get her stabilized quickly so that she can continue with her cranial and spinal radiation.  #2. Left leg weakness. Recent urinary retention. Despite gross absence of cord compression on recent MRI, she remains at high risk for this. She needs to have daily meticulous neurologic exams and attention to any change in her bladder or bowel function. Her initial urinary retention may be a reflection of early cord  compromise.  Patient's husband and brother present. Status and care plan reviewed.

## 2016-05-13 NOTE — ED Notes (Signed)
Pressure was 77/23  MD notified Fluids started

## 2016-05-13 NOTE — ED Notes (Signed)
Rectal temp 98.0

## 2016-05-13 NOTE — ED Provider Notes (Signed)
[facility] DEPT Provider Note   CSN: [id number] Arrival date & time: 05/13/16  1129     History   Chief Complaint Chief Complaint  Patient presents with  . Hypotension    HPI Christina Estes is a 81 y.o. female.  The history is provided by the patient and a relative.  Altered Mental Status   This is a new problem. The current episode started 6 to 12 hours ago. The problem has been gradually worsening. Associated symptoms include somnolence and weakness. Risk factors: radiation therapy for widely metastatic cancer yesterday.    Past Medical History:  Diagnosis Date  . Anxiety   . Coronary artery disease   . Depression   . Diabetes mellitus without complication ([place])   . DOE (dyspnea on exertion)   . GERD (gastroesophageal reflux disease)   . Hypercholesteremia   . Hypertension   . Lung mass 04/22/2016  . Obesity     Patient Active Problem List   Diagnosis Date Noted  . Counseling regarding end of life decision making 05/12/2016  . Decubitus skin ulcer 05/12/2016  . Weight loss 05/12/2016  . Bone metastases ([place]) 05/11/2016  . Adenocarcinoma of left lung ([place]) 04/26/2016  . Acute urinary retention 04/26/2016  . AKI (acute kidney injury) ([place]) 04/26/2016  . Essential hypertension 04/22/2016  . Diabetes ([place]) 04/22/2016  . Adjustment disorder with mixed anxiety and depressed mood 04/22/2016  . Stomach ulcer 04/22/2016  . Gall bladder disease 04/22/2016  . History of back surgery 04/22/2016  . GERD (gastroesophageal reflux disease) 04/22/2016  . Lung mass 04/22/2016    Past Surgical History:  Procedure Laterality Date  . CARDIAC CATHETERIZATION  12/11/2003  . CHOLECYSTECTOMY    . VIDEO BRONCHOSCOPY N/A 04/30/2016   Procedure: VIDEO BRONCHOSCOPY;  Surgeon: [name], MD;  Location: [facility];  Service: Thoracic;  Laterality: N/A;    OB History    No data available       Home Medications    Prior to Admission medications   Medication Sig  Start Date End Date Taking? Authorizing Provider  ALPRAZolam Duanne Moron) 1 MG tablet Take 1 tablet (1 mg total) by mouth at bedtime as needed for anxiety. 05/01/16   Norman Herrlich, MD  atorvastatin (LIPITOR) 40 MG tablet Take 40 mg by mouth daily.    Historical Provider, MD  carvedilol (COREG) 6.25 MG tablet Take 6.25 mg by mouth 2 (two) times daily with a meal.    Historical Provider, MD  cyclobenzaprine (FLEXERIL) 10 MG tablet Take 1 tablet (10 mg total) by mouth 3 (three) times daily as needed for muscle spasms. 05/01/16   Norman Herrlich, MD  dronabinol (MARINOL) 2.5 MG capsule Take 1 capsule (2.5 mg total) by mouth 2 (two) times daily before a meal. 05/07/16   Patrici Ranks, MD  esomeprazole ([place]) 40 MG capsule  04/15/16   Historical Provider, MD  fentaNYL (DURAGESIC - DOSED MCG/HR) 12 MCG/HR Place 1 patch (12.5 mcg total) onto the skin every 3 (three) days. 05/02/16   Norman Herrlich, MD  gabapentin (NEURONTIN) 400 MG capsule Take 400 mg by mouth at bedtime.    Historical Provider, MD  losartan-hydrochlorothiazide (HYZAAR) 100-25 MG tablet Take 1 tablet by mouth daily.    Historical Provider, MD  metFORMIN (GLUCOPHAGE) 500 MG tablet Take 500 mg by mouth 2 (two) times daily with a meal.     Historical Provider, MD  oxyCODONE (OXY IR/ROXICODONE) 5 MG immediate release tablet Take 1 tablet (5  mg total) by mouth every 4 (four) hours as needed for severe pain. 05/01/16   Norman Herrlich, MD  traMADol (ULTRAM) 50 MG tablet Take 1 tablet (50 mg total) by mouth every 6 (six) hours as needed for moderate pain. 05/01/16   Norman Herrlich, MD  vitamin B-12 (CYANOCOBALAMIN) 1000 MCG tablet Take 1,000 mcg by mouth daily.    Historical Provider, MD    Family History Family History  Problem Relation Age of Onset  . Cancer Mother   . Diabetes Mellitus II Son   . Cancer Brother     Social History Social History  Substance Use Topics  . Smoking status: Never Smoker  . Smokeless tobacco: Never Used  .  Alcohol use No     Allergies   No known allergies   Review of Systems Review of Systems  Constitutional: Positive for appetite change (has not eaten or drank today).  Gastrointestinal: Positive for nausea and vomiting (few times today).  Neurological: Positive for weakness.  All other systems reviewed and are negative.    Physical Exam Updated Vital Signs BP (!) 101/40   Pulse 65   Resp 21   Ht '5\' 8"'$  (1.727 m)   Wt 205 lb (93 kg)   SpO2 93%   BMI 31.17 kg/m   Physical Exam  Constitutional: She is oriented to person, place, and time. She appears listless. She appears toxic.  HENT:  Head: Normocephalic and atraumatic.  Eyes: Conjunctivae are normal.  Neck: Neck supple. No tracheal deviation present.  Cardiovascular: Normal rate, regular rhythm and normal heart sounds.   Pulmonary/Chest: Effort normal and breath sounds normal. No respiratory distress.  Abdominal: Soft. She exhibits no distension. There is tenderness (mild diffuse).  Neurological: She is oriented to person, place, and time. She appears listless.  Skin: Skin is warm and dry.  Psychiatric: Her affect is blunt. Her speech is slurred. She is slowed.     ED Treatments / Results  Labs (all labs ordered are listed, but only abnormal results are displayed) Labs Reviewed - No data to display  EKG  EKG Interpretation None       Radiology No results found.  Procedures Procedures (including critical care time)  Emergency Focused Ultrasound Exam Limited Ultrasound Assessment for the evaluation of Hypotension ([facility]USH PROTOCOL)  Performed and interpreted by Dr. Laneta Simmers Indication: Hypotension Multiple images of the bilateral lungs, heart, inferior vena cava, abdomen, and abdominal aorta are obtained for the purposes of estimating presence/absence of pneumothorax, cardiac contractility, volume status, abdominal free fluid and aortic aneurysm with a multifrequency probe. Findings: + B lines and sliding lung,  no anechoic fluid in abdomen (limited by presence of foley which is in empty urinary bladder), nml cardiac contractility, no anechoic fluid surrounding heart, complete IVC collapse, no aortic dilation Interpretation: no pneumothorax, no hemoperitoneum on limited evaluation d/t foley, no foley obstruction, no pericardial effusion, depressed CVP, no abdominal aortic aneurysm Images archived electronically.  CPT Codes: thorax [id number],  cardiac [id number], abdomen [phone], limited retroperitoneal [phone] (study includes all codes)  CRITICAL CARE Performed by: Leo Grosser Total critical care time: 30 minutes Critical care time was exclusive of separately billable procedures and treating other patients. Critical care was necessary to treat or prevent imminent or life-threatening deterioration. Critical care was time spent personally by me on the following activities: development of treatment plan with patient and/or surrogate as well as nursing, discussions with consultants, evaluation of patient's response to treatment, examination of patient, obtaining history from  patient or surrogate, ordering and performing treatments and interventions, ordering and review of laboratory studies, ordering and review of radiographic studies, pulse oximetry and re-evaluation of patient's condition.  Medications Ordered in ED Medications - No data to display   Initial Impression / Assessment and Plan / ED Course  I have reviewed the triage vital signs and the nursing notes.  Pertinent labs & imaging results that were available during my care of the patient were reviewed by me and considered in my medical decision making (see chart for details).  Clinical Course   81 y.o. female presents with Hypotension. Initial ultrasound evaluation is concerning for severe volume depletion. Foley bag was emptied this morning around 8 AM per family and 4-5 hours later has only 100 mL of output without signs of obstruction. Patient had  vomiting yesterday after undergoing palliative radiotherapy of the brain and spine for metastatic [facility] at [facility].   Low BP responsive with large volume crystalloid resuscitation and Pt's somnolence improves. Able to sit upright and interact more appropriately. Course complicated by beta blockade which did not allow appropriate HR and notable for AKI likely prerenal. Internal medicine was consulted for admission and will see the patient in the emergency department.   Final Clinical Impressions(s) / ED Diagnoses   Final diagnoses:  Hypovolemic shock ([place])  Current use of beta blocker  AKI (acute kidney injury) [facility])    New Prescriptions New Prescriptions   No medications on file     Leo Grosser, MD 05/13/16 1743

## 2016-05-13 NOTE — Telephone Encounter (Signed)
-----   Message from [name], PA-C sent at 05/12/2016  6:29 PM EDT ----- Stable

## 2016-05-13 NOTE — Telephone Encounter (Signed)
LMOM that labs are stable and if they had any questions they could call us back.

## 2016-05-13 NOTE — ED Triage Notes (Signed)
Pt was her urologist this AM when they found her to be hypotensive. Patients skin is warm and dry, A/Ox4 but lethargic which is her norm according to her husband. Not complaing of nausea or vomiting, dizziness, or lightheadedness. Pt. Is receiving treatment for cancer and wears a fentanyl patch

## 2016-05-14 ENCOUNTER — Encounter (HOSPITAL_COMMUNITY): Payer: Self-pay | Admitting: General Practice

## 2016-05-14 DIAGNOSIS — R634 Abnormal weight loss: Secondary | ICD-10-CM | POA: Diagnosis not present

## 2016-05-14 DIAGNOSIS — R29898 Other symptoms and signs involving the musculoskeletal system: Secondary | ICD-10-CM | POA: Diagnosis present

## 2016-05-14 DIAGNOSIS — Z960 Presence of urogenital implants: Secondary | ICD-10-CM

## 2016-05-14 DIAGNOSIS — L8990 Pressure ulcer of unspecified site, unspecified stage: Secondary | ICD-10-CM | POA: Diagnosis present

## 2016-05-14 DIAGNOSIS — C7931 Secondary malignant neoplasm of brain: Secondary | ICD-10-CM

## 2016-05-14 DIAGNOSIS — N179 Acute kidney failure, unspecified: Secondary | ICD-10-CM

## 2016-05-14 DIAGNOSIS — R339 Retention of urine, unspecified: Secondary | ICD-10-CM

## 2016-05-14 DIAGNOSIS — C7951 Secondary malignant neoplasm of bone: Secondary | ICD-10-CM

## 2016-05-14 DIAGNOSIS — C3432 Malignant neoplasm of lower lobe, left bronchus or lung: Secondary | ICD-10-CM

## 2016-05-14 DIAGNOSIS — C3490 Malignant neoplasm of unspecified part of unspecified bronchus or lung: Secondary | ICD-10-CM | POA: Diagnosis present

## 2016-05-14 DIAGNOSIS — E871 Hypo-osmolality and hyponatremia: Secondary | ICD-10-CM | POA: Diagnosis present

## 2016-05-14 DIAGNOSIS — E860 Dehydration: Secondary | ICD-10-CM | POA: Diagnosis present

## 2016-05-14 DIAGNOSIS — E875 Hyperkalemia: Secondary | ICD-10-CM | POA: Diagnosis present

## 2016-05-14 DIAGNOSIS — I951 Orthostatic hypotension: Secondary | ICD-10-CM | POA: Diagnosis not present

## 2016-05-14 DIAGNOSIS — L8991 Pressure ulcer of unspecified site, stage 1: Secondary | ICD-10-CM

## 2016-05-14 DIAGNOSIS — E43 Unspecified severe protein-calorie malnutrition: Secondary | ICD-10-CM | POA: Diagnosis present

## 2016-05-14 LAB — CBC
HCT: 31.4 % — ABNORMAL LOW (ref 36.0–46.0)
HEMOGLOBIN: 9.7 g/dL — AB (ref 12.0–15.0)
MCH: 26.1 pg (ref 26.0–34.0)
MCHC: 30.9 g/dL (ref 30.0–36.0)
MCV: 84.6 fL (ref 78.0–100.0)
Platelets: 233 10*3/uL (ref 150–400)
RBC: 3.71 MIL/uL — AB (ref 3.87–5.11)
RDW: 15.8 % — ABNORMAL HIGH (ref 11.5–15.5)
WBC: 7.2 10*3/uL (ref 4.0–10.5)

## 2016-05-14 LAB — BASIC METABOLIC PANEL
ANION GAP: 8 (ref 5–15)
BUN: 38 mg/dL — ABNORMAL HIGH (ref 6–20)
CALCIUM: 8.5 mg/dL — AB (ref 8.9–10.3)
CO2: 22 mmol/L (ref 22–32)
Chloride: 108 mmol/L (ref 101–111)
Creatinine, Ser: 2.1 mg/dL — ABNORMAL HIGH (ref 0.44–1.00)
GFR, EST AFRICAN AMERICAN: 25 mL/min — AB (ref >60)
GFR, EST NON AFRICAN AMERICAN: 21 mL/min — AB (ref >60)
Glucose, Bld: 103 mg/dL — ABNORMAL HIGH (ref 65–99)
POTASSIUM: 4.9 mmol/L (ref 3.5–5.1)
Sodium: 138 mmol/L (ref 135–145)

## 2016-05-14 LAB — URINE CULTURE

## 2016-05-14 MED ORDER — BOOST / RESOURCE BREEZE PO LIQD
1.0000 | Freq: Every day | ORAL | Status: DC
  Administered 2016-05-14: 1 via ORAL

## 2016-05-14 NOTE — Consult Note (Signed)
[facility] Nurse wound consult note Reason for Consult: sacral wound Wound type: stage I pressure ulcer Pressure Ulcer POA: Yes Measurement: 3cm Wound bed: non blanchable Drainage (amount, consistency, odor) none Periwound: intact Dressing procedure/placement/frequency: Cleansed and applied Sacral Foam pad. I have provided nurses with orders for sacral foam dressing protocol. We will not follow, but will remain available to this patient, to nursing, and the medical and/or surgical teams.  Please re-consult if we need to assist further.     Fara Olden, RN-C, WTA-C Wound Treatment Associate

## 2016-05-14 NOTE — Care Management Note (Signed)
Case Management Note Marvetta Gibbons RN, BSN Unit 2W-Case Manager [phone]  Patient Details  Name: DENAE ZULUETA MRN: [id number] Date of Birth: Dec 24, 1934  Subjective/Objective:  Pt admitted with hypotension                  Action/Plan: PTA pt was at [facility] - recently discharged there on 05/01/16- CSW following - ?return to SNF vs Hospice- CM will follow along as will CSW  Expected Discharge Date:                  Expected Discharge Plan:  [place]  In-House Referral:  Clinical Social Work  Discharge planning Services  CM Consult  Post Acute Care Choice:    Choice offered to:     DME Arranged:    DME Agency:     HH Arranged:    [place] Agency:     Status of Service:  In process, will continue to follow  If discussed at Long Length of Stay Meetings, dates discussed:    Additional Comments:  Dawayne Patricia, RN 05/14/2016, 2:27 PM

## 2016-05-14 NOTE — Progress Notes (Addendum)
   Subjective: Patient was evaluated this morning on rounds. She had family at the bedside.  She was laying comfortably in bed and she denied any suprapubic pain or dysuria.    Objective:  Vital signs in last 24 hours: Vitals:   05/13/16 1625 05/13/16 2125 05/14/16 0450 05/14/16 1344  BP: (!) 120/47 (!) 120/42 91/74 (!) 133/42  Pulse: 78 74 73 67  Resp: (!) '22 18 20 17  '$ Temp: 97.8 F (36.6 C) 97.8 F (36.6 C) 98.6 F (37 C) 98.3 F (36.8 C)  TempSrc: Oral Oral Oral Oral  SpO2: 96% 98% 97% 98%  Weight: 212 lb 4.9 oz (96.3 kg)     Height: '5\' 8"'$  (1.727 m)      Physical Exam  Cardiovascular: Normal rate, regular rhythm and normal heart sounds.  Exam reveals no gallop and no friction rub.   No murmur heard. Pulmonary/Chest: Effort normal and breath sounds normal. No respiratory distress. She has no wheezes. She has no rales.  Abdominal: Soft. She exhibits no distension. There is no tenderness.  Musculoskeletal:  5/5 strength in the right upper and lower extremity  3/5 strength in left lower extremity 5/5 strength in left upper extremity   Neurological: She is alert.  Skin: Skin is warm and dry.  Psychiatric: Affect normal.    Assessment/Plan:  Active Problems:   Hypovolemic shock (HCC)   Pressure injury of skin  Hypotension Urinalysis showed urinary tract infection. She is being treated with ceftriaxone. Patient's blood pressures are improving and currently 133/42. - Discontinuing fluids, will monitor blood pressures  Acute kidney injury Creatinine 2.1, baseline appears to be 1.3. Likely pre-renal - Bmet in the morning   Urinaryretention Patient had a Foley placed in last admission for urinary retention. On day of current admission she was at her urology appointment for removal of Foley however was transferred to [facility] for hypotension. We have removed her Foley.  Per nurse patient has been having good urine output - Restart foley if urinary retention  occurs  Lung cancer with metastasis to lumbar/thoracic spine and brain Has been following with oncology outpatient. Pain controlled with fentanyl 12.5 MCG patch, gabapentin 400 mg at bedtime, and oxycodone immediate release 5 mg every 4 hours when necessary. - Continue home pain medications - cortisol level   Severe malnutrition  Patient has had 25 pound weight loss 04/30/16. Patient had nutrition consult -Boost  Pressure ulcer Patient has grade 1 pressure ulcer. -Wound care consult  Dispo: Anticipated discharge in approximately 1 day.   Valinda Party, DO 05/14/2016, 3:17 PM Pager: [phone]

## 2016-05-14 NOTE — Clinical Social Work Note (Signed)
Clinical Social Work Assessment  Patient Details  Name: Christina Estes MRN: [id number] Date of Birth: Dec 21, 1934  Date of referral:  05/14/16               Reason for consult:  Facility Placement                Permission sought to share information with:  Facility Sport and exercise psychologist, Family Supports Permission granted to share information::  Yes, Verbal Permission Granted  Name::     International aid/development worker::  SNF  Relationship::  Husband  Contact Information:     Housing/Transportation Living arrangements for the past 2 months:  [place] of Information:  Patient, Spouse Patient Interpreter Needed:  None Criminal Activity/Legal Involvement Pertinent to Current Situation/Hospitalization:  No - Comment as needed Significant Relationships:  Spouse Lives with:  Spouse Do you feel safe going back to the place where you live?  Yes Need for family participation in patient care:  Yes (Comment)  Care giving concerns:  Patient from [facility]. Patient plans to return.    Social Worker assessment / plan:  CSW met with patient and patient's husband. Patient reported she plans to return to [facility] to continue short term rehab. CSW will facilitate discharge to [facility] once the patient is medically stable.   Employment status:  Retired Forensic scientist:    PT Recommendations:  [place] / Referral to community resources:  [place]  Patient/Family's Response to care:  The patient appears happy with the care she is receiving in hospital and is appreciative of CSW assistance.  Patient/Family's Understanding of and Emotional Response to Diagnosis, Current Treatment, and Prognosis:  The patient has a good understanding of why she was admitted. She understands the care plan and what she will need post discharge.  Emotional Assessment Appearance:  Appears stated age Attitude/Demeanor/Rapport:    (Patient was appropriate.) Affect (typically observed):  Accepting, Appropriate, Calm Orientation:  Oriented to Self, Oriented to Situation, Oriented to Place, Oriented to  Time Alcohol / Substance use:  Not Applicable Psych involvement (Current and /or in the community):  No (Comment)  Discharge Needs  Concerns to be addressed:  Discharge Planning Concerns Readmission within the last 30 days:  Yes Current discharge risk:  Physical Impairment Barriers to Discharge:  Continued Medical Work up   [employer], LCSW 05/14/2016, 5:03 PM

## 2016-05-14 NOTE — Progress Notes (Signed)
Initial Nutrition Assessment  DOCUMENTATION CODES:   Severe malnutrition in context of chronic illness, Obesity unspecified  INTERVENTION:    Boost Breeze po daily, each supplement provides 250 kcal and 9 grams of protein   NUTRITION DIAGNOSIS:   Malnutrition related to catabolic illness as evidenced by percent weight loss, energy intake < or equal to 50% for > or equal to 1 month  GOAL:   Patient will meet greater than or equal to 90% of their needs  MONITOR:   PO intake, Supplement acceptance, Labs, Weight trends, Skin, I & O's  REASON FOR ASSESSMENT:   Consult  (weight loss)  ASSESSMENT:   81 year old Female with PMHx of recently diagnoseed Stage 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     Patient reports a poor appetite during hospitalization and for the past several months. Reports she would consume about 1 meal per day. States "no food tastes good, even drinks". PO intake variable at 10-50% per flowsheet records. Also reveals she's had a 40 lb weight loss in the last 2 months (15%) >> severe for time frame.  Nutrition focused physical exam completed.  No muscle or subcutaneous fat depletion noticed.  Diet Order:  Diet regular Room service appropriate? Yes; Fluid consistency: Thin  Skin:  Reviewed, no issues  Last BM:  9/29  Height:   Ht Readings from Last 1 Encounters:  05/13/16 '5\' 8"'$  (1.727 m)    Weight:   Wt Readings from Last 1 Encounters:  05/13/16 212 lb 4.9 oz (96.3 kg)   Wt Readings from Last 20 Encounters:  05/13/16 212 lb 4.9 oz (96.3 kg)  05/07/16 205 lb (93 kg)  04/30/16 230 lb 13.2 oz (104.7 kg)  04/22/16 220 lb (99.8 kg)  09/30/15 230 lb (104.3 kg)  09/11/15 231 lb (104.8 kg)     Ideal Body Weight:  63.6 kg  BMI:  Body mass index is 32.28 kg/m.  Estimated Nutritional Needs:   Kcal:  1800-2000  Protein:  100-110 gm  Fluid:  1.8-2.0 L  EDUCATION NEEDS:   No education needs identified at this time  Arthur Holms, RD, LDN Pager #: [phone] After-Hours Pager #: [phone]

## 2016-05-15 ENCOUNTER — Encounter (HOSPITAL_COMMUNITY): Payer: Self-pay | Admitting: Oncology

## 2016-05-15 DIAGNOSIS — C439 Malignant melanoma of skin, unspecified: Secondary | ICD-10-CM | POA: Insufficient documentation

## 2016-05-15 DIAGNOSIS — N390 Urinary tract infection, site not specified: Secondary | ICD-10-CM | POA: Diagnosis present

## 2016-05-15 LAB — BASIC METABOLIC PANEL
Anion gap: 10 (ref 5–15)
BUN: 21 mg/dL — AB (ref 6–20)
CHLORIDE: 106 mmol/L (ref 101–111)
CO2: 24 mmol/L (ref 22–32)
CREATININE: 1.27 mg/dL — AB (ref 0.44–1.00)
Calcium: 9.6 mg/dL (ref 8.9–10.3)
GFR calc Af Amer: 45 mL/min — ABNORMAL LOW (ref >60)
GFR calc non Af Amer: 39 mL/min — ABNORMAL LOW (ref >60)
GLUCOSE: 127 mg/dL — AB (ref 65–99)
POTASSIUM: 4.5 mmol/L (ref 3.5–5.1)
Sodium: 140 mmol/L (ref 135–145)

## 2016-05-15 LAB — CORTISOL-AM, BLOOD: Cortisol - AM: 13.5 ug/dL (ref 6.7–22.6)

## 2016-05-15 MED ORDER — SENNOSIDES-DOCUSATE SODIUM 8.6-50 MG PO TABS: 1.0000 | ORAL_TABLET | Freq: Every evening | ORAL | 1 refills | Status: DC | PRN

## 2016-05-15 MED ORDER — LEVOFLOXACIN 750 MG PO TABS
750.0000 mg | ORAL_TABLET | Freq: Every day | ORAL | Status: DC
Start: 2016-05-15 — End: 2016-05-15
  Administered 2016-05-15: 750 mg via ORAL
  Filled 2016-05-15: qty 1

## 2016-05-15 MED ORDER — CARVEDILOL 6.25 MG PO TABS: 6.2500 mg | ORAL_TABLET | Freq: Two times a day (BID) | ORAL | Status: DC

## 2016-05-15 MED ORDER — LEVOFLOXACIN 750 MG PO TABS: 750.0000 mg | ORAL_TABLET | Freq: Every day | ORAL | 0 refills | Status: DC

## 2016-05-15 NOTE — Discharge Instructions (Signed)
Ms. Shanin, Szymanowski were hospitalized due to low blood pressures and a urinary tract infection. Both of these are doing much better. We will restart your carvedilol, but will not restart your lisinopril and hydrochlorothiazide due to your blood pressures being low. They can restart these at your facility if they need to. We will treat you for 2 more days for your urinary tract infection with an oral antibiotic.  Please be sure to follow up with your medical oncologist for further treatment planning.

## 2016-05-15 NOTE — Progress Notes (Signed)
Call placed to [facility].  Report given.

## 2016-05-15 NOTE — Progress Notes (Signed)
Patient will DC to: [facility] Anticipated DC date: 05/15/16 Family notified: Family at bedside Transport by: Family   Per MD patient ready for DC back to [facility]. Per facility, no FL2 needed. RN, patient, patient's family, and facility notified of DC. Discharge Summary sent to facility. RN given number for report [phone])   CSW signing off.  Cedric Fishman, [place] Social Worker [phone]

## 2016-05-15 NOTE — Progress Notes (Signed)
Order received to discharge.  Telemetry removed and CCMD notified.  IV's removed with catheters intact.  Discharge education given to Pt and family.  Copy of AVS given to family and copy being sent with family for facility.  Sent copy of signed DNR with family.  All questions answered.  Pt denies chest pain or sob at this time.  Pt stable to DC.

## 2016-05-15 NOTE — Progress Notes (Signed)
Subjective: Patient was seen and examined this morning. She feels somewhat nauseous this morning. She denies any chest pain, shortness of breath, or lightheadedness. She has been ambulating some to the bathroom. She denies urinary retention and is glad to have her foley out.   Objective: Vital signs in last 24 hours: Vitals:   05/14/16 0450 05/14/16 1344 05/14/16 2118 05/15/16 0527  BP: 91/74 (!) 133/42 (!) 125/31 (!) 162/64  Pulse: 73 67 78 74  Resp: '20 17 18 18  '$ Temp: 98.6 F (37 C) 98.3 F (36.8 C) 98.6 F (37 C) 97.9 F (36.6 C)  TempSrc: Oral Oral Oral Oral  SpO2: 97% 98% 95% 98%  Weight:      Height:       Physical Exam General: Vital signs reviewed.  Patient is chronically ill appearing, in no acute distress and cooperative with exam.  Cardiovascular: RRR Pulmonary/Chest: Clear to auscultation bilaterally, no wheezes, rales, or rhonchi. 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    Hypotension: Resolved after IVF. Likely secondary to recent nausea with vomiting and poor po intake and continued use of antihypertensives. Her concomitant UTI may have been contributing as well. Her blood pressures have remained normotensive off of  fluid and she is now hypertensive to 162/64 this morning.  -Encourage po intake -Restart home Coreg 6.25 mg BID -Continue to hold losartan-HCTZ; can add back as able -Discharge back to living facility today  Complicated UTI: Likely secondary to retained foley which has now been removed. UA consistent with UTI in the setting of dysuria and foley use. Patient was started on ceftriaxone and has remained afebrile. Leukocytosis has resolved. Given that UTI is complicated from AKI and Foley use, we will transition to Levaquin 750 mg for a 5 day course.  -Ceftriaxone 9/28>>9/29 -Levaquin 750 mg QD 9/30>>10/2  AKI: Resolved after IVF. Now at baseline with creatinine of 1.27.  -Continue to hold losartan-HCTZ  UrinaryRetention: Foley was removed on admission and patient has been having good urine output. -1652 out yesterday. -Continue to monitor -Follow up with Urology has outpatient as needed  Stage IV Adenocarcinoma of the Lung with Metastasis to lumbar/thoracic spine and brain: She was recently diagnosed with a 5 cm spiculated lung mass in her left lower lobe and has been following with oncology outpatient. MRI thoracic and lumbar spine showed widespread osseous metastasis. On 9/15 bronchoscopy was performed for lung mass biopsy and showed Stage IV non small cell carcinoma. MRI brain showed extensive brain metastases. Pain controlled with fentanyl 12.5 MCG patch, gabapentin 400 mg at bedtime, and oxycodone immediate release 5 mg every 4 hours when necessary. She has been receiving palliative radiation. Plans are to follow up with her medical oncologist on 05/21/16 for discussion of [facility] results to  determine if she is a candidate for targeted therapy or to consider home hospice. -Continue home pain medications -Follow up with Medical Oncology and Radiation Oncology  Weight Loss: Patient has had 25 pound weight loss 04/30/16 secondary to poor po intake due to lack of appetite and nausea.    -Nutrition consult -Regular diet -Encourage po intake -Phenergan prn nausea  Pressure Ulcer: Patient has grade 1 pressure ulcer. -Continue skin care  DVT/PE ppx: Heparin TID CODE: DNR FEN: FULL  Dispo: Anticipated discharge in approximately 0 day(s).   LOS: 0 days   Martyn Malay, DO PGY-3 Internal Medicine Resident Pager # [phone] 05/15/2016 8:43 AM

## 2016-05-15 NOTE — Discharge Summary (Signed)
Name: Christina Estes MRN: [id number] DOB: [birth year] 81 y.o. PCP: [name], MD  Date of Admission: 05/13/2016 11:29 AM Date of Discharge: 05/15/2016 Attending Physician: Annia Belt, MD  Discharge Diagnosis:  Principal Problem:   Hypovolemic shock ([place]) Active Problems:   AKI (acute kidney injury) ([place])   Protein-calorie malnutrition, severe   Dehydration   Primary lung cancer with metastasis from lung to other site [facility])   Left leg weakness   Pressure ulcer   UTI (urinary tract infection)   Discharge Medications:   Medication List    STOP taking these medications   losartan-hydrochlorothiazide 100-25 MG tablet Commonly known as:  HYZAAR     TAKE these medications   albuterol (2.5 MG/3ML) 0.083% nebulizer solution Commonly known as:  PROVENTIL Take 2.5 mg by nebulization every 4 (four) hours as needed for wheezing.   ALPRAZolam 1 MG tablet Commonly known as:  XANAX Take 1 tablet (1 mg total) by mouth at bedtime as needed for anxiety.   carvedilol 6.25 MG tablet Commonly known as:  COREG Take 6.25 mg by mouth 2 (two) times daily with a meal.   cyclobenzaprine 10 MG tablet Commonly known as:  FLEXERIL Take 1 tablet (10 mg total) by mouth 3 (three) times daily as needed for muscle spasms. What changed:  when to take this   dronabinol 2.5 MG capsule Commonly known as:  MARINOL Take 1 capsule (2.5 mg total) by mouth 2 (two) times daily before a meal.   fentaNYL 12 MCG/HR Commonly known as:  DURAGESIC - dosed mcg/hr Place 1 patch (12.5 mcg total) onto the skin every 3 (three) days.   gabapentin 400 MG capsule Commonly known as:  NEURONTIN Take 400 mg by mouth at bedtime.   levofloxacin 750 MG tablet Commonly known as:  LEVAQUIN Take 1 tablet (750 mg total) by mouth daily.   metFORMIN 1000 MG (MOD) 24 hr tablet Commonly known as:  GLUMETZA Take 1,000 mg by mouth daily with breakfast.   omeprazole 20 MG tablet Commonly known as:   PRILOSEC OTC Take 20 mg by mouth daily.   oxyCODONE 5 MG immediate release tablet Commonly known as:  Oxy IR/ROXICODONE Take 1 tablet (5 mg total) by mouth every 4 (four) hours as needed for severe pain.   promethazine 25 MG tablet Commonly known as:  PHENERGAN Take 25 mg by mouth every 4 (four) hours as needed for nausea or vomiting (4 dose maximum in 24 hours).   senna-docusate 8.6-50 MG tablet Commonly known as:  Senokot-S Take 1 tablet by mouth at bedtime as needed for mild constipation.   traMADol 50 MG tablet Commonly known as:  ULTRAM Take 1 tablet (50 mg total) by mouth every 6 (six) hours as needed for moderate pain.   vitamin B-12 1000 MCG tablet Commonly known as:  CYANOCOBALAMIN Take 1,000 mcg by mouth daily.       Disposition and follow-up:   Christina Estes was discharged from [facility] in Good condition.  At the hospital follow up visit please address:  Hypotension: Now hypertensive. Please consider adding back losartan-HCTZ if her blood pressure remains elevated.  Complicated UTI: Likely secondary to retained foley which has now been removed. She will complete her course of Levaquin 750 mg QD on 10/2 for a 5 day course.  AKI: Resolved after IVF. Now at baseline with creatinine of 1.27. I am continuing to hold losartan-HCTZ on discharge.  2.  Labs / imaging needed at  time of follow-up: BMET   3.  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   Hypotension: This resolved after IV fluids and holding her blood pressure medications. Likely secondary to recent nausea with vomiting and poor po intake and continued use of antihypertensives. Her concomitant UTI may have been contributing as well. Her blood pressures have remained normotensive off of fluid and she is now hypertensive to 162/64 this morning. I have encouraged po intake and I have restarted her home Coreg 6.25 mg BID. I am continuing to hold her losartan-HCTZ, but this can be added back if her blood pressure remains elevated.   Complicated UTI: Likely secondary to retained foley which has now been removed. UA consistent with UTI in the setting of dysuria and foley use. Patient was started on ceftriaxone and has remained afebrile. Leukocytosis has resolved. Given that UTI is complicated from AKI and Foley use, we will transition to Levaquin 750 mg for a 5 day course. Her Urine culture grew multiple species of which Levaquin should cover. She will complete her course of Levaquin 750 mg QD on 10/2 for a 5 day course.  AKI: Resolved after IVF. Now at baseline with creatinine of 1.27. I am continuing to hold losartan-HCTZ on discharge.  UrinaryRetention: Foley was removed on admission and patient has been having good urine output. -1652 out yesterday.  Stage IV Adenocarcinoma of the Lung with Metastasis to lumbar/thoracic spine and brain: She was recently diagnosed with a 5 cm spiculated lung mass in her left lower lobe and has been following with oncology outpatient. MRI thoracic and lumbar spine showed widespread osseous metastasis. On 9/15 bronchoscopy was performed for lung mass biopsy and showed Stage IV non small cell carcinoma. MRI brain  showed extensive brain metastases. Pain controlled with fentanyl 12.5 MCG patch, gabapentin 400 mg at bedtime, and oxycodone immediate release 5 mg every 4 hours when necessary. She has been receiving palliative radiation. Plans are to follow up with her medical oncologist on 05/21/16 for discussion of Guardant results to determine if she is a candidate for targeted therapy or to consider home hospice.  Weight Loss: Patient has had 25 pound weight loss 04/30/16 secondary to poor po intake due to lack of appetite and nausea. We encouraged oral intake and provided phenergan as needed for nausea.  Pressure Ulcer: Patient has grade 1 pressure ulcer. Please continue skin care.  Discharge Vitals:   BP (!) 162/64 (BP Location: Right Arm)   Pulse 74   Temp 97.9 F (36.6 C) (Oral)   Resp 18   Ht '5\' 8"'$  (1.727 m)   Wt 212 lb 4.9 oz (96.3 kg)   SpO2 98%   BMI 32.28 kg/m    Discharge Instructions: Discharge Instructions    Diet - low sodium  heart healthy    Complete by:  As directed    Increase activity slowly    Complete by:  As directed      Signed: Martyn Malay, DO PGY-3 Internal Medicine Resident Pager # [phone] 05/15/2016 8:53 AM

## 2016-05-17 ENCOUNTER — Telehealth (HOSPITAL_COMMUNITY): Payer: Self-pay | Admitting: Emergency Medicine

## 2016-05-17 NOTE — Telephone Encounter (Signed)
Spoke with husband Christina Estes.  Christina Estes after receiving radiation on Thursday last week got sick throwing up.  Friday her blood pressure dropped real low and was admitted to [facility] for 2 days.  Today Christina Estes stated that she did not want to do radiation ant more.  I called to see how they wanted to precede.  He said he didn't really know.  I told him that hospice was an option, they  Could offer them a lot of support either at the nursing home, at her home, or at the hospice house.  I cleared up the fact that on hospice she could not be treated or have radiation treatments.  Christina Estes at this point doesn't know what they want to do.  He was going to talk some more with his wife.

## 2016-05-18 ENCOUNTER — Telehealth (HOSPITAL_COMMUNITY): Payer: Self-pay | Admitting: Emergency Medicine

## 2016-05-18 NOTE — Telephone Encounter (Signed)
Called husband to check on them.  He said that [place] still did not want to do anymore radiation to the brain.  I told him that if she was sure she did not want to do anymore radiation to the brain no matter what the blood test showed on Friday the pills would not cross to the brain to help that the next step would be to get hospice out to help them.  I told him that I had already talked this over with Dr Whitney Muse and she was in agreement.  He agreed to hospice.  I faxed hospice paperwork over.  They are still going to keep the appt with Dr Whitney Muse on Friday.

## 2016-05-21 ENCOUNTER — Encounter (HOSPITAL_COMMUNITY): Payer: Commercial Managed Care - HMO | Attending: Oncology | Admitting: Hematology & Oncology

## 2016-05-21 VITALS — BP 139/54 | HR 71 | Temp 98.2°F | Resp 18 | Wt 196.0 lb

## 2016-05-21 DIAGNOSIS — N179 Acute kidney failure, unspecified: Secondary | ICD-10-CM | POA: Insufficient documentation

## 2016-05-21 DIAGNOSIS — M546 Pain in thoracic spine: Secondary | ICD-10-CM | POA: Insufficient documentation

## 2016-05-21 DIAGNOSIS — E119 Type 2 diabetes mellitus without complications: Secondary | ICD-10-CM | POA: Insufficient documentation

## 2016-05-21 DIAGNOSIS — M545 Low back pain: Secondary | ICD-10-CM | POA: Insufficient documentation

## 2016-05-21 DIAGNOSIS — C3492 Malignant neoplasm of unspecified part of left bronchus or lung: Secondary | ICD-10-CM | POA: Insufficient documentation

## 2016-05-21 DIAGNOSIS — K219 Gastro-esophageal reflux disease without esophagitis: Secondary | ICD-10-CM | POA: Insufficient documentation

## 2016-05-21 DIAGNOSIS — Z7189 Other specified counseling: Secondary | ICD-10-CM

## 2016-05-21 DIAGNOSIS — Z79899 Other long term (current) drug therapy: Secondary | ICD-10-CM | POA: Insufficient documentation

## 2016-05-21 DIAGNOSIS — E669 Obesity, unspecified: Secondary | ICD-10-CM | POA: Insufficient documentation

## 2016-05-21 DIAGNOSIS — I10 Essential (primary) hypertension: Secondary | ICD-10-CM | POA: Insufficient documentation

## 2016-05-21 DIAGNOSIS — F329 Major depressive disorder, single episode, unspecified: Secondary | ICD-10-CM | POA: Insufficient documentation

## 2016-05-21 DIAGNOSIS — I2510 Atherosclerotic heart disease of native coronary artery without angina pectoris: Secondary | ICD-10-CM | POA: Insufficient documentation

## 2016-05-21 DIAGNOSIS — Z9049 Acquired absence of other specified parts of digestive tract: Secondary | ICD-10-CM | POA: Insufficient documentation

## 2016-05-21 DIAGNOSIS — S22000A Wedge compression fracture of unspecified thoracic vertebra, initial encounter for closed fracture: Secondary | ICD-10-CM

## 2016-05-21 DIAGNOSIS — Z8711 Personal history of peptic ulcer disease: Secondary | ICD-10-CM | POA: Insufficient documentation

## 2016-05-21 DIAGNOSIS — C4362 Malignant melanoma of left upper limb, including shoulder: Secondary | ICD-10-CM | POA: Insufficient documentation

## 2016-05-21 DIAGNOSIS — R627 Adult failure to thrive: Secondary | ICD-10-CM | POA: Insufficient documentation

## 2016-05-21 DIAGNOSIS — Z7984 Long term (current) use of oral hypoglycemic drugs: Secondary | ICD-10-CM | POA: Insufficient documentation

## 2016-05-21 DIAGNOSIS — C7951 Secondary malignant neoplasm of bone: Secondary | ICD-10-CM | POA: Insufficient documentation

## 2016-05-21 DIAGNOSIS — Z9889 Other specified postprocedural states: Secondary | ICD-10-CM | POA: Insufficient documentation

## 2016-05-21 DIAGNOSIS — G893 Neoplasm related pain (acute) (chronic): Secondary | ICD-10-CM

## 2016-05-21 DIAGNOSIS — E7800 Pure hypercholesterolemia, unspecified: Secondary | ICD-10-CM | POA: Insufficient documentation

## 2016-05-21 DIAGNOSIS — R1310 Dysphagia, unspecified: Secondary | ICD-10-CM | POA: Insufficient documentation

## 2016-05-21 DIAGNOSIS — R339 Retention of urine, unspecified: Secondary | ICD-10-CM | POA: Insufficient documentation

## 2016-05-21 DIAGNOSIS — C3490 Malignant neoplasm of unspecified part of unspecified bronchus or lung: Secondary | ICD-10-CM

## 2016-05-21 DIAGNOSIS — R630 Anorexia: Secondary | ICD-10-CM

## 2016-05-21 DIAGNOSIS — C7931 Secondary malignant neoplasm of brain: Secondary | ICD-10-CM | POA: Diagnosis not present

## 2016-05-21 DIAGNOSIS — R634 Abnormal weight loss: Secondary | ICD-10-CM | POA: Insufficient documentation

## 2016-05-21 DIAGNOSIS — F419 Anxiety disorder, unspecified: Secondary | ICD-10-CM | POA: Insufficient documentation

## 2016-05-21 DIAGNOSIS — Z85828 Personal history of other malignant neoplasm of skin: Secondary | ICD-10-CM | POA: Insufficient documentation

## 2016-05-21 MED ORDER — DEXAMETHASONE 4 MG PO TABS: 4.0000 mg | ORAL_TABLET | Freq: Two times a day (BID) | ORAL | 2 refills | Status: DC

## 2016-05-21 MED ORDER — MIRTAZAPINE 15 MG PO TABS: 15.0000 mg | ORAL_TABLET | Freq: Every day | ORAL | 3 refills | Status: DC

## 2016-05-21 NOTE — Progress Notes (Signed)
[facility] Hematology/Oncology Progress Note  Name: Christina Estes      MRN: [id number]    Date: 05/29/2016 Time:4:30 PM   REFERRING PHYSICIAN:  Judd Lien, MD (Primary Care Provider)  REASON FOR CONSULT:  Lung mass    Adenocarcinoma of left lung ([place])   04/25/2016 - 05/01/2016 Est[facility] Admission    Urinary retention, AKI      04/26/2016 Initial Diagnosis    Adenocarcinoma of left lung ([place])     04/26/2016 Imaging    MRI Lumbar/thoracic spine: Widespread osseous metastases involving the lumbar spine.No evidence for cord compression. There is extraosseous extension of tumor at the right posterior aspect of the S2 segment, which could potentially affect the adjacent right sacral nerve roots. No other significant extraosseous extension of tumor. Probable pathologic fracture involving L2 vertebral body with mild 10% height loss without bony retropulsion.Findings consistent with widespread osseous metastases involving the thoracic spine. No significant extra osseous or epidural tumor at this time. No evidence for cord compression or severe canal Stenosis. Pathologic fracture involving the T4 vertebral body with associated 50% height loss without bony retropulsion.      04/29/2016 Initial Biopsy    Bronchoscopy with brushings, needle aspirations, and biopsies. With Dr. Roxan Estes      04/30/2016 Imaging    MRI brain Over 80 cerebral and infratentorial brain metastases. No vasogenic edema. 2. Tiny cortical infarct in the right frontal lobe. 3. 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      04/30/2016 Pathology Results    Adenocarcinoma Guardant testing EGFR mutation, no tissue for tissue testing       HISTORY OF PRESENT ILLNESS:   Christina Estes is a 81 y.o. female with a medical history significant for DM, HTN, GERD, PUD who is referred to the [facility] Estes following discharge from Christina Estes  on 04/22/2016 with CT imaging demonstrating a 5 cm spiculated mass in the left lower lobe with upper thoracic vertebral body lucency with mild compression deformity concerning for pathologic fracture. Final diagnosis is stage IV adenocarcinoma of the lung. Details noted above in onchx. She returns today for ongoing discussion and follow-up of stage IV adenocarcinoma of the lung.   [name] is accompanied by several family members today. She presents in wheelchair.  Since our last visit, she was hospitalized on 05/13/2016 for hypovolemic shock. She was discharged on 05/15/2016.  She has decided to discontinue radiation treatment, WBRT. She felt sick after receiving treatment. Her daughter notes she ate pizza before going to radiation and this is most likely what made her sick. She also did not like having the mask over her face.  She would like to live as long as she could and be healthy. She defines healthy as not taking pills and being able to get out and do as she wants to.  Daughter states the patient was given a phenergan today so she is a little sleepy from it.  Previously prescribed appetite stimulant helped for about two days then she stopped eating again. Family states, "She is not eating". She has lost more weight.   She has already met with Hospice. She has been discharged from the Christina Estes and is going home today.   Review of Systems  Constitutional: Positive for weight loss (16 lbs since 05/13/16). Negative for chills and fever.       Appetite loss  HENT: Negative.   Eyes: Negative.  Negative for blurred vision and double vision.  Respiratory: Negative.  Negative for cough, hemoptysis, sputum production, shortness of breath and wheezing.   Cardiovascular: Positive for chest pain. Negative for leg swelling.       Right chest area pain  Gastrointestinal: Positive for nausea. Negative for abdominal pain and vomiting.  Genitourinary: Negative.   Musculoskeletal: Positive for  back pain.  Skin: Negative.   Neurological: Positive for weakness. Negative for dizziness, sensory change, speech change, focal weakness, seizures, loss of consciousness and headaches.  Endo/Heme/Allergies: Negative.   Psychiatric/Behavioral: Negative.   14 point review of systems was performed and is negative except as detailed under history of present illness and above    PAST MEDICAL HISTORY:   Past Medical History:  Diagnosis Date  . Anxiety   . Arthritis    "qwhere" (05/14/2016)  . Basal cell carcinoma    "arms, hands, face; some cut off/some burned off"  . Cancer ([place])    "I'm eat up w/cancer" (05/14/2016)  . Chronic back pain    "all over my back" (05/14/2016)  . Coronary artery disease   . Depression   . DOE (dyspnea on exertion)   . GERD (gastroesophageal reflux disease)   . History of blood transfusion    "when I was pregnant"  . History of hiatal hernia   . Hypercholesteremia   . Hypertension   . Lung mass 04/22/2016  . Malignant melanoma of arm ([place])    "left"  . Obesity   . PONV (postoperative nausea and vomiting)   . Type II diabetes mellitus ([facility])     ALLERGIES: Allergies  Allergen Reactions  . No Known Allergies       MEDICATIONS: I have reviewed the patient's current medications.    Current Outpatient Prescriptions on File Prior to Visit  Medication Sig Dispense Refill  . albuterol (PROVENTIL) (2.5 MG/3ML) 0.083% nebulizer solution Take 2.5 mg by nebulization every 4 (four) hours as needed for wheezing.    Marland Kitchen ALPRAZolam (XANAX) 1 MG tablet Take 1 tablet (1 mg total) by mouth at bedtime as needed for anxiety. 30 tablet 0  . carvedilol (COREG) 6.25 MG tablet Take 6.25 mg by mouth 2 (two) times daily with a meal.    . cyclobenzaprine (FLEXERIL) 10 MG tablet Take 1 tablet (10 mg total) by mouth 3 (three) times daily as needed for muscle spasms. (Patient taking differently: Take 10 mg by mouth every 8 (eight) hours as needed for muscle spasms. ) 30 tablet 0    . dronabinol (MARINOL) 2.5 MG capsule Take 1 capsule (2.5 mg total) by mouth 2 (two) times daily before a meal. 60 capsule 3  . fentaNYL (DURAGESIC - DOSED MCG/HR) 12 MCG/HR Place 1 patch (12.5 mcg total) onto the skin every 3 (three) days. 5 patch 0  . gabapentin (NEURONTIN) 400 MG capsule Take 400 mg by mouth at bedtime.    Marland Kitchen levofloxacin (LEVAQUIN) 750 MG tablet Take 1 tablet (750 mg total) by mouth daily. 2 tablet 0  . metFORMIN (GLUMETZA) 1000 MG (MOD) 24 hr tablet Take 1,000 mg by mouth daily with breakfast.    . omeprazole (PRILOSEC OTC) 20 MG tablet Take 20 mg by mouth daily.    Marland Kitchen oxyCODONE (OXY IR/ROXICODONE) 5 MG immediate release tablet Take 1 tablet (5 mg total) by mouth every 4 (four) hours as needed for severe pain. 30 tablet 0  . promethazine (PHENERGAN) 25 MG tablet Take 25 mg by mouth every 4 (four) hours as  needed for nausea or vomiting (4 dose maximum in 24 hours).    . senna-docusate (SENOKOT-S) 8.6-50 MG tablet Take 1 tablet by mouth at bedtime as needed for mild constipation. 30 tablet 1  . traMADol (ULTRAM) 50 MG tablet Take 1 tablet (50 mg total) by mouth every 6 (six) hours as needed for moderate pain. 30 tablet 0  . vitamin B-12 (CYANOCOBALAMIN) 1000 MCG tablet Take 1,000 mcg by mouth daily.     No current facility-administered medications on file prior to visit.      PAST SURGICAL HISTORY Past Surgical History:  Procedure Laterality Date  . BACK SURGERY     "mid back; don't know what they did" (05/14/2016)  . BREAST BIOPSY Right   . BREAST LUMPECTOMY Right    "not cancer"  . CARDIAC CATHETERIZATION  12/11/2003  . CARPAL TUNNEL RELEASE Bilateral   . CATARACT EXTRACTION W/ INTRAOCULAR LENS  IMPLANT, BILATERAL    . CHOLECYSTECTOMY OPEN    . DILATION AND CURETTAGE OF UTERUS    . MELANOMA EXCISION Left    "upper arm"  . TONSILLECTOMY  ~ 1944  . VAGINAL HYSTERECTOMY    . VIDEO BRONCHOSCOPY N/A 04/30/2016   Procedure: VIDEO BRONCHOSCOPY;  Surgeon: [name], MD;  Location: [facility];  Service: Thoracic;  Laterality: N/A;    FAMILY HISTORY: Family History  Problem Relation Age of Onset  . Cancer Mother   . Diabetes Mellitus II Son   . Cancer Brother    Mother deceased at age of 5 from "old age." Father deceased at age of 75 from [place].   1 brother deceased at age of 48 from prostate cancer. 7 children, 6 living. 2 grandchildren and 3 great-grandchildren.  SOCIAL HISTORY: NEVER smoker.  NEVER drinker.  No illicit drug abuse.  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  [employer].  Married.  Social History   Social History  . Marital status: Married    Spouse name: N/A  . Number of children: N/A  . Years of education: N/A   Social History Main Topics  . Smoking status: Never Smoker  . Smokeless tobacco: Never Used  . Alcohol use No  . Drug use: No  . Sexual activity: Not Currently   Other Topics Concern  . None   Social History Narrative  . None    PERFORMANCE STATUS: The patient's performance status is 2 - Symptomatic, <50% confined to bed  PHYSICAL EXAM: Most Recent Vital Signs: Blood pressure (!) 139/54, pulse 71, temperature 98.2 F (36.8 C), temperature source Oral, resp. rate 18, weight 196 lb (88.9 kg), SpO2 97 %. Physical Exam  Constitutional: She is oriented to person, place, and time and well-developed, well-nourished, and in no distress. No distress.  Obese. In wheelchair  HENT:  Head: Normocephalic and atraumatic.  Mouth/Throat: Oropharynx is clear and moist.  Eyes: Conjunctivae and EOM are normal. Pupils are equal, round, and reactive to light.  Neck: Normal range of motion. Neck supple.  Cardiovascular: Normal rate, regular rhythm and normal heart sounds.   Pulmonary/Chest: Effort normal and breath sounds normal. No respiratory distress.  Abdominal: Soft. Bowel sounds are normal. She exhibits no distension. There is no tenderness.  Musculoskeletal: Normal range of motion.  Neurological: She is  alert and oriented to person, place, and time. Gait normal.  Skin: Skin is warm and dry. She is not diaphoretic.  Nursing note and vitals reviewed.   LABORATORY DATA:  I have reviewed the data as listed.  Results for  DONNIELLE, ADDISON (MRN [id number]) as of 05/21/2016 15:32  Ref. Range 05/14/2016 03:11 05/14/2016 11:24 05/15/2016 03:58  Sodium Latest Ref Range: 135 - 145 mmol/L 138  140  Potassium Latest Ref Range: 3.5 - 5.1 mmol/L 4.9  4.5  Chloride Latest Ref Range: 101 - 111 mmol/L 108  106  CO2 Latest Ref Range: 22 - 32 mmol/L 22  24  BUN Latest Ref Range: 6 - 20 mg/dL 38 (H)  21 (H)  Creatinine Latest Ref Range: 0.44 - 1.00 mg/dL 2.10 (H)  1.27 (H)  Calcium Latest Ref Range: 8.9 - 10.3 mg/dL 8.5 (L)  9.6  EGFR (Non-African Amer.) Latest Ref Range: >60 mL/min 21 (L)  39 (L)  EGFR (African American) Latest Ref Range: >60 mL/min 25 (L)  45 (L)  Glucose Latest Ref Range: 65 - 99 mg/dL 103 (H)  127 (H)  Anion gap Latest Ref Range: 5 - 15  8  10   WBC Latest Ref Range: 4.0 - 10.5 K/uL 7.2    RBC Latest Ref Range: 3.87 - 5.11 MIL/uL 3.71 (L)    Hemoglobin Latest Ref Range: 12.0 - 15.0 g/dL 9.7 (L)    HCT Latest Ref Range: 36.0 - 46.0 % 31.4 (L)    MCV Latest Ref Range: 78.0 - 100.0 fL 84.6    MCH Latest Ref Range: 26.0 - 34.0 pg 26.1    MCHC Latest Ref Range: 30.0 - 36.0 g/dL 30.9    RDW Latest Ref Range: 11.5 - 15.5 % 15.8 (H)    Platelets Latest Ref Range: 150 - 400 K/uL 233    Cortisol - AM Latest Ref Range: 6.7 - 22.6 ug/dL   13.5  EKG Unknown  Attch     RADIOGRAPHY: I have personally reviewed the radiological images as listed and agreed with the findings in the report.  Study Result   CLINICAL DATA:  Hypotension.  Lung cancer  EXAM: PORTABLE CHEST 1 VIEW  COMPARISON:  CT chest 04/20/2016  FINDINGS: Heart size mildly enlarged.  Negative for heart failure.  Left lower lobe mass density unchanged from the CT.  Negative for pneumonia.  No pleural  effusion.  IMPRESSION: Left lower lobe mass lesion. Negative for heart failure or pneumonia.   Electronically Signed   By: Franchot Gallo M.D.   On: 05/13/2016 13:06      PATHOLOGY:  N/A  ASSESSMENT/PLAN:  Stage IV adenocarcinoma lung Brain metastases NEVER smoker Pathologic FX L2 Osseous metastatic disease Marginal PS Weight loss Urinary retention Grade 1 decubiti, buttocks Discussion about end of life planning EGFR mutation   Labs reviewed. Hospitalization reviewed. Imaging reviewed. We had a very frank discussion today. They understand she has stage IV incurable adenocarcinoma of the lung. Currently patient has opted for hospice. She understands that she has an EGFR mutation and is eligible for several oral targeted therapies.   The patient has decided to discontinue radiation treatment.   Weight loss of 16 lbs since 05/13/2016. I have written her a prescription for Remeron for appetite stimulation. We discussed end of life issues and I advised them that hospice will aid with issues pertaining to pain. I also advised them that loss of appetite and decreased oral intake is a normal part of the "dying process."  I have written her a prescription for dexamethasone for the brain metastases.   The patient will continue with hospice as arranged. She will return for follow up as needed.    MEDICATIONS PRESCRIBED THIS ENCOUNTER: Meds ordered this encounter  Medications  . mirtazapine (REMERON) 15 MG tablet    Sig: Take 1 tablet (15 mg total) by mouth at bedtime.    Dispense:  30 tablet    Refill:  3  . dexamethasone (DECADRON) 4 MG tablet    Sig: Take 1 tablet (4 mg total) by mouth 2 (two) times daily with a meal.    Dispense:  60 tablet    Refill:  2   All questions were answered. The patient knows to call the clinic with any problems, questions or concerns. We can certainly see the patient much sooner if necessary.  This document serves as a record of services  personally performed by Ancil Linsey, MD. It was created on her behalf by Arlyce Harman, a trained medical scribe. The creation of this record is based on the scribe's personal observations and the provider's statements to them. This document has been checked and approved by the attending provider.  I have reviewed the above documentation for accuracy and completeness and I agree with the above.  This note is electronically signed [id number] Cyril Mourning, MD  05/29/2016 4:30 PM

## 2016-05-29 ENCOUNTER — Encounter (HOSPITAL_COMMUNITY): Payer: Self-pay | Admitting: Hematology & Oncology

## 2016-05-29 DIAGNOSIS — C3490 Malignant neoplasm of unspecified part of unspecified bronchus or lung: Secondary | ICD-10-CM | POA: Insufficient documentation

## 2016-06-25 ENCOUNTER — Telehealth (HOSPITAL_COMMUNITY): Payer: Self-pay | Admitting: Emergency Medicine

## 2016-06-25 NOTE — Telephone Encounter (Signed)
Pt, daughter, son, and husband all decided this morning that they would like to come off hospice.  I made an appt to see Dr Whitney Muse on 07/02/2016 at 3:20 pm.  They verbalized understanding.

## 2016-07-02 ENCOUNTER — Encounter (HOSPITAL_COMMUNITY): Payer: Self-pay | Admitting: Hematology & Oncology

## 2016-07-02 ENCOUNTER — Encounter (HOSPITAL_COMMUNITY): Payer: Commercial Managed Care - HMO | Attending: Oncology | Admitting: Hematology & Oncology

## 2016-07-02 ENCOUNTER — Ambulatory Visit (HOSPITAL_COMMUNITY): Payer: Commercial Managed Care - HMO | Admitting: Hematology & Oncology

## 2016-07-02 DIAGNOSIS — C3490 Malignant neoplasm of unspecified part of unspecified bronchus or lung: Secondary | ICD-10-CM

## 2016-07-02 DIAGNOSIS — N179 Acute kidney failure, unspecified: Secondary | ICD-10-CM | POA: Diagnosis not present

## 2016-07-02 DIAGNOSIS — G893 Neoplasm related pain (acute) (chronic): Secondary | ICD-10-CM | POA: Diagnosis not present

## 2016-07-02 DIAGNOSIS — E669 Obesity, unspecified: Secondary | ICD-10-CM | POA: Diagnosis not present

## 2016-07-02 DIAGNOSIS — M545 Low back pain: Secondary | ICD-10-CM | POA: Insufficient documentation

## 2016-07-02 DIAGNOSIS — E7800 Pure hypercholesterolemia, unspecified: Secondary | ICD-10-CM | POA: Insufficient documentation

## 2016-07-02 DIAGNOSIS — C7931 Secondary malignant neoplasm of brain: Secondary | ICD-10-CM

## 2016-07-02 DIAGNOSIS — C3492 Malignant neoplasm of unspecified part of left bronchus or lung: Secondary | ICD-10-CM | POA: Insufficient documentation

## 2016-07-02 DIAGNOSIS — R634 Abnormal weight loss: Secondary | ICD-10-CM | POA: Diagnosis not present

## 2016-07-02 DIAGNOSIS — F329 Major depressive disorder, single episode, unspecified: Secondary | ICD-10-CM | POA: Diagnosis not present

## 2016-07-02 DIAGNOSIS — R1310 Dysphagia, unspecified: Secondary | ICD-10-CM | POA: Diagnosis not present

## 2016-07-02 DIAGNOSIS — R339 Retention of urine, unspecified: Secondary | ICD-10-CM | POA: Insufficient documentation

## 2016-07-02 DIAGNOSIS — I10 Essential (primary) hypertension: Secondary | ICD-10-CM | POA: Insufficient documentation

## 2016-07-02 DIAGNOSIS — C4362 Malignant melanoma of left upper limb, including shoulder: Secondary | ICD-10-CM | POA: Diagnosis not present

## 2016-07-02 DIAGNOSIS — K219 Gastro-esophageal reflux disease without esophagitis: Secondary | ICD-10-CM | POA: Diagnosis not present

## 2016-07-02 DIAGNOSIS — Z79899 Other long term (current) drug therapy: Secondary | ICD-10-CM | POA: Insufficient documentation

## 2016-07-02 DIAGNOSIS — I2510 Atherosclerotic heart disease of native coronary artery without angina pectoris: Secondary | ICD-10-CM | POA: Diagnosis not present

## 2016-07-02 DIAGNOSIS — Z9889 Other specified postprocedural states: Secondary | ICD-10-CM | POA: Insufficient documentation

## 2016-07-02 DIAGNOSIS — F419 Anxiety disorder, unspecified: Secondary | ICD-10-CM | POA: Insufficient documentation

## 2016-07-02 DIAGNOSIS — Z7984 Long term (current) use of oral hypoglycemic drugs: Secondary | ICD-10-CM | POA: Diagnosis not present

## 2016-07-02 DIAGNOSIS — Z85828 Personal history of other malignant neoplasm of skin: Secondary | ICD-10-CM | POA: Diagnosis not present

## 2016-07-02 DIAGNOSIS — Z8711 Personal history of peptic ulcer disease: Secondary | ICD-10-CM | POA: Insufficient documentation

## 2016-07-02 DIAGNOSIS — M546 Pain in thoracic spine: Secondary | ICD-10-CM | POA: Insufficient documentation

## 2016-07-02 DIAGNOSIS — R627 Adult failure to thrive: Secondary | ICD-10-CM | POA: Diagnosis not present

## 2016-07-02 DIAGNOSIS — C7951 Secondary malignant neoplasm of bone: Secondary | ICD-10-CM | POA: Diagnosis not present

## 2016-07-02 DIAGNOSIS — R918 Other nonspecific abnormal finding of lung field: Secondary | ICD-10-CM | POA: Diagnosis present

## 2016-07-02 DIAGNOSIS — E119 Type 2 diabetes mellitus without complications: Secondary | ICD-10-CM | POA: Insufficient documentation

## 2016-07-02 DIAGNOSIS — Z9049 Acquired absence of other specified parts of digestive tract: Secondary | ICD-10-CM | POA: Insufficient documentation

## 2016-07-02 DIAGNOSIS — S22000A Wedge compression fracture of unspecified thoracic vertebra, initial encounter for closed fracture: Secondary | ICD-10-CM

## 2016-07-02 DIAGNOSIS — Z7189 Other specified counseling: Secondary | ICD-10-CM

## 2016-07-02 LAB — CBC WITH DIFFERENTIAL/PLATELET
BASOS ABS: 0 10*3/uL (ref 0.0–0.1)
BASOS PCT: 0 %
EOS ABS: 0 10*3/uL (ref 0.0–0.7)
EOS PCT: 0 %
HCT: 36 % (ref 36.0–46.0)
HEMOGLOBIN: 11.6 g/dL — AB (ref 12.0–15.0)
Lymphocytes Relative: 14 %
Lymphs Abs: 1.5 10*3/uL (ref 0.7–4.0)
MCH: 28.6 pg (ref 26.0–34.0)
MCHC: 32.2 g/dL (ref 30.0–36.0)
MCV: 88.7 fL (ref 78.0–100.0)
Monocytes Absolute: 0.4 10*3/uL (ref 0.1–1.0)
Monocytes Relative: 4 %
NEUTROS PCT: 82 %
Neutro Abs: 8.4 10*3/uL — ABNORMAL HIGH (ref 1.7–7.7)
PLATELETS: 195 10*3/uL (ref 150–400)
RBC: 4.06 MIL/uL (ref 3.87–5.11)
RDW: 19.4 % — ABNORMAL HIGH (ref 11.5–15.5)
WBC: 10.3 10*3/uL (ref 4.0–10.5)

## 2016-07-02 LAB — COMPREHENSIVE METABOLIC PANEL
ALT: 37 U/L (ref 14–54)
AST: 23 U/L (ref 15–41)
Albumin: 3.2 g/dL — ABNORMAL LOW (ref 3.5–5.0)
Alkaline Phosphatase: 132 U/L — ABNORMAL HIGH (ref 38–126)
Anion gap: 8 (ref 5–15)
BILIRUBIN TOTAL: 0.5 mg/dL (ref 0.3–1.2)
BUN: 27 mg/dL — AB (ref 6–20)
CALCIUM: 8.9 mg/dL (ref 8.9–10.3)
CHLORIDE: 101 mmol/L (ref 101–111)
CO2: 29 mmol/L (ref 22–32)
CREATININE: 0.8 mg/dL (ref 0.44–1.00)
Glucose, Bld: 236 mg/dL — ABNORMAL HIGH (ref 65–99)
Potassium: 3.9 mmol/L (ref 3.5–5.1)
Sodium: 138 mmol/L (ref 135–145)
TOTAL PROTEIN: 6.2 g/dL — AB (ref 6.5–8.1)

## 2016-07-02 MED ORDER — DEXAMETHASONE 4 MG PO TABS: 8.0000 mg | ORAL_TABLET | Freq: Three times a day (TID) | ORAL | 1 refills | Status: DC
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
## 2016-07-06 ENCOUNTER — Ambulatory Visit (HOSPITAL_COMMUNITY)
Admission: RE | Admit: 2016-07-06 | Discharge: 2016-07-06 | Disposition: A | Discharge status: Home / Self Care | Source: Ambulatory Visit | Attending: Hematology & Oncology | Admitting: Hematology & Oncology

## 2016-07-06 DIAGNOSIS — C7931 Secondary malignant neoplasm of brain: Secondary | ICD-10-CM | POA: Diagnosis not present

## 2016-07-06 DIAGNOSIS — I2510 Atherosclerotic heart disease of native coronary artery without angina pectoris: Secondary | ICD-10-CM | POA: Diagnosis not present

## 2016-07-06 DIAGNOSIS — C7951 Secondary malignant neoplasm of bone: Secondary | ICD-10-CM | POA: Insufficient documentation

## 2016-07-06 DIAGNOSIS — C3492 Malignant neoplasm of unspecified part of left bronchus or lung: Secondary | ICD-10-CM | POA: Diagnosis not present

## 2016-07-06 DIAGNOSIS — R918 Other nonspecific abnormal finding of lung field: Secondary | ICD-10-CM | POA: Insufficient documentation

## 2016-07-06 DIAGNOSIS — I700 Atherosclerosis of aorta: Secondary | ICD-10-CM | POA: Insufficient documentation

## 2016-07-06 MED ORDER — IOPAMIDOL (ISOVUE-300) INJECTION 61%
75.0000 mL | Freq: Once | INTRAVENOUS | Status: AC | PRN
  Administered 2016-07-06: 75 mL via INTRAVENOUS

## 2016-07-06 MED ORDER — GADOBENATE DIMEGLUMINE 529 MG/ML IV SOLN
20.0000 mL | Freq: Once | INTRAVENOUS | Status: AC | PRN
  Administered 2016-07-06: 18 mL via INTRAVENOUS

## 2016-07-07 ENCOUNTER — Ambulatory Visit (HOSPITAL_COMMUNITY): Payer: Commercial Managed Care - HMO

## 2016-07-07 ENCOUNTER — Telehealth (HOSPITAL_COMMUNITY): Payer: Self-pay | Admitting: Emergency Medicine

## 2016-07-07 DIAGNOSIS — C3492 Malignant neoplasm of unspecified part of left bronchus or lung: Secondary | ICD-10-CM

## 2016-07-07 DIAGNOSIS — C7931 Secondary malignant neoplasm of brain: Secondary | ICD-10-CM

## 2016-07-07 MED ORDER — MIRTAZAPINE 15 MG PO TABS: 15.0000 mg | ORAL_TABLET | Freq: Every day | ORAL | 1 refills | Status: DC

## 2016-07-07 NOTE — Telephone Encounter (Signed)
Called Carloyn Manner to tell him that the brain MRI looked about the same.  There was no new progression of disease and actually the mets that were there looked slightly smaller in size.  Dr Whitney Muse said that she would still need whole brain radiation in the future to help with symptoms.  We would write her a prescription for Iressa next week and get that started.  Rolonda needs a refill on Remeron and I will call that into the pharmacy at [facility].

## 2016-07-12 ENCOUNTER — Other Ambulatory Visit (HOSPITAL_COMMUNITY): Payer: Self-pay | Admitting: Emergency Medicine

## 2016-07-12 MED ORDER — GEFITINIB 250 MG PO TABS: 250.0000 mg | ORAL_TABLET | Freq: Every day | ORAL | 1 refills | Status: DC

## 2016-07-12 NOTE — Progress Notes (Signed)
iressa script written

## 2016-07-14 ENCOUNTER — Encounter (HOSPITAL_COMMUNITY): Payer: Self-pay | Admitting: Emergency Medicine

## 2016-07-14 ENCOUNTER — Emergency Department (HOSPITAL_COMMUNITY): Payer: Medicare Other

## 2016-07-14 ENCOUNTER — Observation Stay (HOSPITAL_COMMUNITY)
Admission: EM | Admit: 2016-07-14 | Discharge: 2016-07-15 | Disposition: A | Discharge status: Skilled Nursing Facility | Payer: Medicare Other | Attending: Internal Medicine | Admitting: Internal Medicine

## 2016-07-14 DIAGNOSIS — C7931 Secondary malignant neoplasm of brain: Secondary | ICD-10-CM | POA: Diagnosis not present

## 2016-07-14 DIAGNOSIS — R531 Weakness: Secondary | ICD-10-CM | POA: Diagnosis present

## 2016-07-14 DIAGNOSIS — R410 Disorientation, unspecified: Secondary | ICD-10-CM | POA: Diagnosis not present

## 2016-07-14 DIAGNOSIS — I2510 Atherosclerotic heart disease of native coronary artery without angina pectoris: Secondary | ICD-10-CM | POA: Diagnosis not present

## 2016-07-14 DIAGNOSIS — W19XXXA Unspecified fall, initial encounter: Secondary | ICD-10-CM | POA: Diagnosis not present

## 2016-07-14 DIAGNOSIS — Z79899 Other long term (current) drug therapy: Secondary | ICD-10-CM | POA: Insufficient documentation

## 2016-07-14 DIAGNOSIS — Y92009 Unspecified place in unspecified non-institutional (private) residence as the place of occurrence of the external cause: Secondary | ICD-10-CM | POA: Insufficient documentation

## 2016-07-14 DIAGNOSIS — I10 Essential (primary) hypertension: Secondary | ICD-10-CM | POA: Diagnosis not present

## 2016-07-14 DIAGNOSIS — G9340 Encephalopathy, unspecified: Secondary | ICD-10-CM

## 2016-07-14 DIAGNOSIS — C3490 Malignant neoplasm of unspecified part of unspecified bronchus or lung: Secondary | ICD-10-CM | POA: Diagnosis not present

## 2016-07-14 DIAGNOSIS — E119 Type 2 diabetes mellitus without complications: Secondary | ICD-10-CM | POA: Diagnosis not present

## 2016-07-14 DIAGNOSIS — R4182 Altered mental status, unspecified: Secondary | ICD-10-CM

## 2016-07-14 DIAGNOSIS — N390 Urinary tract infection, site not specified: Secondary | ICD-10-CM | POA: Diagnosis present

## 2016-07-14 DIAGNOSIS — E118 Type 2 diabetes mellitus with unspecified complications: Secondary | ICD-10-CM

## 2016-07-14 DIAGNOSIS — Y9389 Activity, other specified: Secondary | ICD-10-CM | POA: Diagnosis not present

## 2016-07-14 DIAGNOSIS — C3492 Malignant neoplasm of unspecified part of left bronchus or lung: Secondary | ICD-10-CM | POA: Diagnosis present

## 2016-07-14 DIAGNOSIS — Y999 Unspecified external cause status: Secondary | ICD-10-CM | POA: Insufficient documentation

## 2016-07-14 DIAGNOSIS — Z794 Long term (current) use of insulin: Secondary | ICD-10-CM | POA: Diagnosis not present

## 2016-07-14 DIAGNOSIS — E860 Dehydration: Secondary | ICD-10-CM | POA: Diagnosis not present

## 2016-07-14 HISTORY — DX: Malignant neoplasm of unspecified part of unspecified bronchus or lung: C34.90

## 2016-07-14 LAB — CBC WITH DIFFERENTIAL/PLATELET
BASOS ABS: 0 10*3/uL (ref 0.0–0.1)
BASOS PCT: 0 %
EOS ABS: 0 10*3/uL (ref 0.0–0.7)
Eosinophils Relative: 0 %
HCT: 38.1 % (ref 36.0–46.0)
Hemoglobin: 13 g/dL (ref 12.0–15.0)
Lymphocytes Relative: 7 %
Lymphs Abs: 1 10*3/uL (ref 0.7–4.0)
MCH: 29.5 pg (ref 26.0–34.0)
MCHC: 34.1 g/dL (ref 30.0–36.0)
MCV: 86.6 fL (ref 78.0–100.0)
MONO ABS: 0.7 10*3/uL (ref 0.1–1.0)
Monocytes Relative: 5 %
NEUTROS ABS: 13 10*3/uL — AB (ref 1.7–7.7)
NEUTROS PCT: 88 %
PLATELETS: 150 10*3/uL (ref 150–400)
RBC: 4.4 MIL/uL (ref 3.87–5.11)
RDW: 18.5 % — ABNORMAL HIGH (ref 11.5–15.5)
WBC: 14.7 10*3/uL — ABNORMAL HIGH (ref 4.0–10.5)

## 2016-07-14 LAB — COMPREHENSIVE METABOLIC PANEL
ALBUMIN: 3 g/dL — AB (ref 3.5–5.0)
ALT: 59 U/L — ABNORMAL HIGH (ref 14–54)
ANION GAP: 15 (ref 5–15)
AST: 39 U/L (ref 15–41)
Alkaline Phosphatase: 116 U/L (ref 38–126)
BUN: 39 mg/dL — ABNORMAL HIGH (ref 6–20)
CHLORIDE: 94 mmol/L — AB (ref 101–111)
CO2: 26 mmol/L (ref 22–32)
Calcium: 8.9 mg/dL (ref 8.9–10.3)
Creatinine, Ser: 0.83 mg/dL (ref 0.44–1.00)
GFR calc non Af Amer: >60 mL/min (ref >60)
Glucose, Bld: 391 mg/dL — ABNORMAL HIGH (ref 65–99)
POTASSIUM: 4.5 mmol/L (ref 3.5–5.1)
SODIUM: 135 mmol/L (ref 135–145)
Total Bilirubin: 1.3 mg/dL — ABNORMAL HIGH (ref 0.3–1.2)
Total Protein: 5.8 g/dL — ABNORMAL LOW (ref 6.5–8.1)

## 2016-07-14 LAB — URINALYSIS, ROUTINE W REFLEX MICROSCOPIC
Bilirubin Urine: NEGATIVE
Glucose, UA: >1000 mg/dL — AB
KETONES UR: NEGATIVE mg/dL
NITRITE: NEGATIVE
PH: 6 (ref 5.0–8.0)
Protein, ur: NEGATIVE mg/dL
SPECIFIC GRAVITY, URINE: 1.02 (ref 1.005–1.030)

## 2016-07-14 LAB — GLUCOSE, CAPILLARY: Glucose-Capillary: 310 mg/dL — ABNORMAL HIGH (ref 65–99)

## 2016-07-14 LAB — URINE MICROSCOPIC-ADD ON

## 2016-07-14 MED ORDER — GABAPENTIN 400 MG PO CAPS
800.0000 mg | ORAL_CAPSULE | Freq: Every day | ORAL | Status: DC
  Administered 2016-07-14: 800 mg via ORAL
  Filled 2016-07-14: qty 2

## 2016-07-14 MED ORDER — DEXTROSE 5 % IV SOLN
1.0000 g | INTRAVENOUS | Status: DC
  Filled 2016-07-14: qty 10

## 2016-07-14 MED ORDER — ALPRAZOLAM 1 MG PO TABS: 1.0000 mg | ORAL_TABLET | Freq: Every evening | ORAL | Status: DC | PRN

## 2016-07-14 MED ORDER — ONDANSETRON HCL 4 MG PO TABS: 4.0000 mg | ORAL_TABLET | Freq: Four times a day (QID) | ORAL | Status: DC | PRN

## 2016-07-14 MED ORDER — SODIUM CHLORIDE 0.9 % IV SOLN
INTRAVENOUS | Status: DC
  Administered 2016-07-14: 17:00:00 via INTRAVENOUS

## 2016-07-14 MED ORDER — DEXAMETHASONE 4 MG PO TABS
ORAL_TABLET | ORAL | Status: AC
  Filled 2016-07-14: qty 1

## 2016-07-14 MED ORDER — INSULIN ASPART 100 UNIT/ML ~~LOC~~ SOLN
0.0000 [IU] | Freq: Three times a day (TID) | SUBCUTANEOUS | Status: DC
  Administered 2016-07-15: 15 [IU] via SUBCUTANEOUS
  Administered 2016-07-15: 4 [IU] via SUBCUTANEOUS

## 2016-07-14 MED ORDER — DEXTROSE 5 % IV SOLN
1.0000 g | Freq: Once | INTRAVENOUS | Status: AC
  Administered 2016-07-14: 1 g via INTRAVENOUS
  Filled 2016-07-14: qty 10

## 2016-07-14 MED ORDER — SODIUM CHLORIDE 0.9 % IV BOLUS (SEPSIS)
1000.0000 mL | Freq: Once | INTRAVENOUS | Status: AC
  Administered 2016-07-14: 1000 mL via INTRAVENOUS

## 2016-07-14 MED ORDER — MIRTAZAPINE 15 MG PO TABS
15.0000 mg | ORAL_TABLET | Freq: Every day | ORAL | Status: DC
  Administered 2016-07-14: 15 mg via ORAL
  Filled 2016-07-14: qty 1

## 2016-07-14 MED ORDER — ACETAMINOPHEN 325 MG PO TABS: 650.0000 mg | ORAL_TABLET | Freq: Four times a day (QID) | ORAL | Status: DC | PRN

## 2016-07-14 MED ORDER — ACETAMINOPHEN 650 MG RE SUPP: 650.0000 mg | Freq: Four times a day (QID) | RECTAL | Status: DC | PRN

## 2016-07-14 MED ORDER — SODIUM CHLORIDE 0.9 % IV SOLN
INTRAVENOUS | Status: DC
  Administered 2016-07-14: 19:00:00 via INTRAVENOUS

## 2016-07-14 MED ORDER — GEFITINIB 250 MG PO TABS
250.0000 mg | ORAL_TABLET | Freq: Every day | ORAL | Status: DC
  Administered 2016-07-15: 250 mg via ORAL
  Filled 2016-07-14 (×3): qty 1

## 2016-07-14 MED ORDER — ONDANSETRON HCL 4 MG/2ML IJ SOLN: 4.0000 mg | Freq: Four times a day (QID) | INTRAMUSCULAR | Status: DC | PRN

## 2016-07-14 MED ORDER — OXYCODONE HCL 5 MG PO TABS
5.0000 mg | ORAL_TABLET | ORAL | Status: DC | PRN
Start: 2016-07-14 — End: 2016-07-15

## 2016-07-14 MED ORDER — INSULIN GLARGINE 100 UNIT/ML ~~LOC~~ SOLN
10.0000 [IU] | Freq: Every day | SUBCUTANEOUS | Status: DC
  Administered 2016-07-14: 10 [IU] via SUBCUTANEOUS
  Filled 2016-07-14 (×2): qty 0.1

## 2016-07-14 MED ORDER — ENOXAPARIN SODIUM 60 MG/0.6ML ~~LOC~~ SOLN
50.0000 mg | SUBCUTANEOUS | Status: DC
  Administered 2016-07-14: 50 mg via SUBCUTANEOUS
  Filled 2016-07-14: qty 0.6

## 2016-07-14 MED ORDER — INSULIN ASPART 100 UNIT/ML ~~LOC~~ SOLN
0.0000 [IU] | Freq: Every day | SUBCUTANEOUS | Status: DC
  Administered 2016-07-14: 4 [IU] via SUBCUTANEOUS

## 2016-07-14 MED ORDER — FUROSEMIDE 40 MG PO TABS
40.0000 mg | ORAL_TABLET | Freq: Every day | ORAL | Status: DC
  Administered 2016-07-15: 40 mg via ORAL
  Filled 2016-07-14: qty 1

## 2016-07-14 MED ORDER — DEXAMETHASONE 4 MG PO TABS
4.0000 mg | ORAL_TABLET | Freq: Two times a day (BID) | ORAL | Status: DC
  Administered 2016-07-14 – 2016-07-15 (×2): 4 mg via ORAL
  Filled 2016-07-14 (×4): qty 1

## 2016-07-14 NOTE — H&P (Signed)
History and Physical    Christina Estes JQB:[id number] DOB: [birth year] DOA: 07/14/2016  PCP: [name], MD  Patient coming from: home  Chief Complaint: confusion  HPI: Christina Estes is a 81 y.o. female with medical history significant of stage IV lung cancer with metastases to brain, diabetes, hypertension. Patient is followed at the oncology clinic and up until recently, was being followed by hospice. She recently discontinued hospice services approximately 2 weeks ago when she started to feel better. Her son reports that she had recently seen her local oncologist approximately 2 weeks ago when dexamethasone dose was increased from 4 mg twice a day to 8 mg 3 times a day. Over the past week, her son had noticed a change in her mentation. She was behaving more confused. There is no reported fever. No dysuria. No vomiting or diarrhea. He reports that her by mouth intake may not be adequate, although improved from before. Overnight, mentation had worsened and she became increasingly confused. She was talking to herself overnight and thought that her bedroom was the bathroom. She is brought to the ER for evaluation.  ED Course: In the ED, vital signs are noted to be stable. She had mild leukocytosis of 14,000. Urinalysis indicated a possible infection. Glucose was significantly elevated at 391. Chest x-ray did not show any acute findings and CT head did not show any acute change from prior imaging. She was given a dose of Rocephin, IV fluids and referred for admission.  Review of Systems: As per HPI otherwise 10 point review of systems negative.    Past Medical History:  Diagnosis Date  . Anxiety   . Arthritis    "qwhere" (05/14/2016)  . Basal cell carcinoma    "arms, hands, face; some cut off/some burned off"  . Cancer ([place])    "I'm eat up w/cancer" (05/14/2016)  . Chronic back pain    "all over my back" (05/14/2016)  . Coronary artery disease   . Depression   . DOE (dyspnea on  exertion)   . GERD (gastroesophageal reflux disease)   . History of blood transfusion    "when I was pregnant"  . History of hiatal hernia   . Hypercholesteremia   . Hypertension   . Lung cancer [facility])    pt son reports lung cancer then diagnosed with brain and bone metastisis.  . Lung mass 04/22/2016  . Malignant melanoma of arm ([place])    "left"  . Obesity   . PONV (postoperative nausea and vomiting)   . Type II diabetes mellitus ([place])     Past Surgical History:  Procedure Laterality Date  . BACK SURGERY     "mid back; don't know what they did" (05/14/2016)  . BREAST BIOPSY Right   . BREAST LUMPECTOMY Right    "not cancer"  . CARDIAC CATHETERIZATION  12/11/2003  . CARPAL TUNNEL RELEASE Bilateral   . CATARACT EXTRACTION W/ INTRAOCULAR LENS  IMPLANT, BILATERAL    . CHOLECYSTECTOMY OPEN    . DILATION AND CURETTAGE OF UTERUS    . MELANOMA EXCISION Left    "upper arm"  . TONSILLECTOMY  ~ 1944  . VAGINAL HYSTERECTOMY    . VIDEO BRONCHOSCOPY N/A 04/30/2016   Procedure: VIDEO BRONCHOSCOPY;  Surgeon: [name], MD;  Location: [place];  Service: Thoracic;  Laterality: N/A;     reports that she has never smoked. She has never used smokeless tobacco. She reports that she does not drink alcohol or use drugs.  Allergies  Allergen Reactions  . No Known Allergies     Family History  Problem Relation Age of Onset  . Cancer Mother   . Diabetes Mellitus II Son   . Cancer Brother      Prior to Admission medications   Medication Sig Start Date End Date Taking? Authorizing Provider  ALPRAZolam Duanne Moron) 1 MG tablet Take 1 tablet (1 mg total) by mouth at bedtime as needed for anxiety. 05/01/16  Yes Norman Herrlich, MD  dexamethasone (DECADRON) 4 MG tablet Take 2 tablets (8 mg total) by mouth 3 (three) times daily. 07/02/16  Yes Patrici Ranks, MD  furosemide (LASIX) 40 MG tablet Take 40 mg by mouth daily.  06/24/16  Yes Historical Provider, MD  gefitinib (IRESSA) 250 MG tablet Take  1 tablet (250 mg total) by mouth daily. 07/12/16  Yes Manon Hilding Kefalas, PA-C  mirtazapine (REMERON) 15 MG tablet Take 1 tablet (15 mg total) by mouth at bedtime. 07/07/16  Yes Patrici Ranks, MD  oxyCODONE (OXY IR/ROXICODONE) 5 MG immediate release tablet Take 1 tablet (5 mg total) by mouth every 4 (four) hours as needed for severe pain. 05/01/16  Yes Norman Herrlich, MD  gabapentin (NEURONTIN) 400 MG capsule 2 capsules at bedtime. 05/28/16   Historical Provider, MD    Physical Exam: Vitals:   07/14/16 1451 07/14/16 1526 07/14/16 1639 07/14/16 1654  BP: 156/79 151/83 148/85 (!) 152/69  Pulse: 68 72 78 85  Resp: '17 17 18 18  '$ Temp:    98.4 F (36.9 C)  TempSrc:    Oral  SpO2: 95% 96% 95% 98%  Weight:    98.9 kg (218 lb)  Height:    '5\' 8"'$  (1.727 m)      Constitutional: NAD, calm, comfortable Vitals:   07/14/16 1451 07/14/16 1526 07/14/16 1639 07/14/16 1654  BP: 156/79 151/83 148/85 (!) 152/69  Pulse: 68 72 78 85  Resp: '17 17 18 18  '$ Temp:    98.4 F (36.9 C)  TempSrc:    Oral  SpO2: 95% 96% 95% 98%  Weight:    98.9 kg (218 lb)  Height:    '5\' 8"'$  (1.727 m)   Eyes: PERRL, lids and conjunctivae normal ENMT: Mucous membranes are moist. Posterior pharynx clear of any exudate or lesions.Normal dentition.  Neck: normal, supple, no masses, no thyromegaly Respiratory: clear to auscultation bilaterally, no wheezing, no crackles. Normal respiratory effort. No accessory muscle use.  Cardiovascular: Regular rate and rhythm, no murmurs / rubs / gallops.1-2+ extremity edema (chronic). 2+ pedal pulses. No carotid bruits.  Abdomen: no tenderness, no masses palpated. No hepatosplenomegaly. Bowel sounds positive.  Musculoskeletal: no clubbing / cyanosis. No joint deformity upper and lower extremities. Good ROM, no contractures. Normal muscle tone.  Skin: no rashes, lesions, ulcers. No induration Neurologic: CN 2-12 grossly intact. Sensation intact, DTR normal. Strength 5/5 in all 4.    Psychiatric: Normal judgment and insight. Alert and oriented x 3. Normal mood.    Labs on Admission: I have personally reviewed following labs and imaging studies  CBC:  Recent Labs Lab 07/14/16 1027  WBC 14.7*  NEUTROABS 13.0*  HGB 13.0  HCT 38.1  MCV 86.6  PLT 443   Basic Metabolic Panel:  Recent Labs Lab 07/14/16 1027  NA 135  K 4.5  CL 94*  CO2 26  GLUCOSE 391*  BUN 39*  CREATININE 0.83  CALCIUM 8.9   GFR: Estimated Creatinine Clearance: 67.6 mL/min (by C-G formula based on SCr of  0.83 mg/dL). Liver Function Tests:  Recent Labs Lab 07/14/16 1027  AST 39  ALT 59*  ALKPHOS 116  BILITOT 1.3*  PROT 5.8*  ALBUMIN 3.0*   No results for input(s): LIPASE, AMYLASE in the last 168 hours. No results for input(s): AMMONIA in the last 168 hours. Coagulation Profile: No results for input(s): INR, PROTIME in the last 168 hours. Cardiac Enzymes: No results for input(s): CKTOTAL, CKMB, CKMBINDEX, TROPONINI in the last 168 hours. BNP (last 3 results) No results for input(s): PROBNP in the last 8760 hours. HbA1C: No results for input(s): HGBA1C in the last 72 hours. CBG: No results for input(s): GLUCAP in the last 168 hours. Lipid Profile: No results for input(s): CHOL, HDL, LDLCALC, TRIG, CHOLHDL, LDLDIRECT in the last 72 hours. Thyroid Function Tests: No results for input(s): TSH, T4TOTAL, FREET4, T3FREE, THYROIDAB in the last 72 hours. Anemia Panel: No results for input(s): VITAMINB12, FOLATE, FERRITIN, TIBC, IRON, RETICCTPCT in the last 72 hours. Urine analysis:    Component Value Date/Time   COLORURINE STRAW (A) 07/14/2016 1006   APPEARANCEUR HAZY (A) 07/14/2016 1006   LABSPEC 1.020 07/14/2016 1006   PHURINE 6.0 07/14/2016 1006   GLUCOSEU >1000 (A) 07/14/2016 1006   HGBUR MODERATE (A) 07/14/2016 1006   BILIRUBINUR NEGATIVE 07/14/2016 1006   KETONESUR NEGATIVE 07/14/2016 1006   PROTEINUR NEGATIVE 07/14/2016 1006   NITRITE NEGATIVE 07/14/2016 1006    LEUKOCYTESUR SMALL (A) 07/14/2016 1006   Sepsis Labs: !!!!!!!!!!!!!!!!!!!!!!!!!!!!!!!!!!!!!!!!!!!! '@LABRCNTIP'$ (procalcitonin:4,lacticidven:4) )No results found for this or any previous visit (from the past 240 hour(s)).   Radiological Exams on Admission: Ct Head Wo Contrast  Result Date: 07/14/2016 CLINICAL DATA:  Altered mental status. Lung cancer with metastatic disease to the brain. EXAM: CT HEAD WITHOUT CONTRAST TECHNIQUE: Contiguous axial images were obtained from the base of the skull through the vertex without intravenous contrast. COMPARISON:  MRI brain 07/06/2016 and 04/30/2016. FINDINGS: Brain: Innumerable calcified metastatic lesions are again noted. There is no acute hemorrhage or significant mass effect. Ventricles are normal size. No significant extra-axial fluid collection is present. No acute infarct is present. Mild atrophy and diffuse white matter disease is noted bilaterally. The basal ganglia are intact. Vascular: Cavernous internal carotid artery calcifications are asymmetric on the right. There is no hyperdense vessel. Skull: Known lytic lesions are again seen within the calvarium. No new lesions are present. Sinuses/Orbits: The paranasal sinuses and mastoid air cells are clear. Bilateral lens replacements are noted. The globes and orbits are intact. IMPRESSION: 1. Innumerable punctate calcified lesions throughout the brain compatible metastatic disease. This may represent calcification following treatment. 2. No significant mass effect or large new lesions. 3. No acute intracranial abnormality. Electronically Signed   By: San Morelle M.D.   On: 07/14/2016 12:17   Dg Chest Port 1 View  Result Date: 07/14/2016 CLINICAL DATA:  Altered mental status. EXAM: PORTABLE CHEST 1 VIEW COMPARISON:  05/13/2016 FINDINGS: Heart is normal size. No confluent airspace opacities or effusions. No acute bony abnormality. IMPRESSION: No active disease. Electronically Signed   By: Rolm Baptise  M.D.   On: 07/14/2016 13:50     Assessment/Plan Active Problems:   Essential hypertension   Diabetes ([place])   Adenocarcinoma of left lung (H[facility])   Dehydration   UTI (urinary tract infection)   Acute encephalopathy   Brain metastases ([place])    1. Acute encephalopathy. Possibly related to urinary tract infection. May also be related to increased dosage of steroids. Since arrival to the ER, mental status is  already improving. Will decrease dose of steroids to 4 mg twice a day. Continue mild hydration and continue to observe.  2. Possible urinary tract infection. Started on Rocephin. Follow cultures.  3. Stage IV lung cancer with brain metastases. Follow-up with oncology. Patient has an appointment on 12/1. Continue on steroids, although a lower dose.  4. Diabetes. Patient reports she was previously on metformin, but this was discontinued when she was followed with hospice. She is to continue on steroids, she will likely need some insulin therapy. Start on sliding scale insulin. Check hemoglobin A1c.   DVT prophylaxis: lovenox Code Status: DNR Family Communication: discussed with son at the bedside Disposition Plan: discharge home once improved Consults called:  Admission status: observation   [place] MD [facility] Pager [phone]  If 7PM-7AM, please contact night-coverage [email or web address] Password [facility]  07/14/2016, 6:43 PM

## 2016-07-14 NOTE — Progress Notes (Signed)
Pharmacy clarification: adjusted lovenox to 50 mg sq q24h (0.5 mg/kg) for DVT pxl based on patients BMI of >30 (33.1).  [place] PharmD BCPS 07/14/2016 6:56 PM

## 2016-07-14 NOTE — ED Triage Notes (Addendum)
Pt son reports increased confusion since Monday. Pt son reports pt went to go the bathroom and went in the bedroom and reports has been talking to self all night. Pt alert to self and place at time of triage. Pt reports generalized weakness. nad noted. Pt denies pain.  Pt son reports prednisone was increased to six pills a day on Friday. PCP office contacted and recommended pt be evaluated in ED.

## 2016-07-14 NOTE — ED Notes (Signed)
Pt stable and ready for transport to [facility].  Report called to Barbra Sarks, RN.

## 2016-07-14 NOTE — ED Provider Notes (Signed)
[facility] DEPT Provider Note   CSN: [id number] Arrival date & time: 07/14/16  0915  By signing my name below, I, Higinio Plan, attest that this documentation has been prepared under the direction and in the presence of Nat Christen, MD . Electronically Signed: Higinio Plan, Scribe. 07/14/2016. 11:02 AM.  History   Chief Complaint Chief Complaint  Patient presents with  . Altered Mental Status   The history is provided by the spouse. No language interpreter was used.   LEVEL V CAVEAT and Limited ROS due to AMS.   HPI Comments: Christina Estes is a 81 y.o. female with PMHx of lung cancer, CAD and Type II DM, who presents to the Emergency Department accompanied by her husband for an evaluation of AMS. Per husband, pt "was talking out of her head and was confused" at ~5:00 PM yesterday evening. He notes pt has hx of "lung and brain cancer" and recently had her steroids increased ~2 weeks ago. Pt now complains of weakness and is having difficulty ambulating. Pt's husband reports this is the first time pt has experienced similar symptoms. She denies fever, chills, dysuria, chest pain and dyspnea.   Past Medical History:  Diagnosis Date  . Anxiety   . Arthritis    "qwhere" (05/14/2016)  . Basal cell carcinoma    "arms, hands, face; some cut off/some burned off"  . Cancer ([place])    "I'm eat up w/cancer" (05/14/2016)  . Chronic back pain    "all over my back" (05/14/2016)  . Coronary artery disease   . Depression   . DOE (dyspnea on exertion)   . GERD (gastroesophageal reflux disease)   . History of blood transfusion    "when I was pregnant"  . History of hiatal hernia   . Hypercholesteremia   . Hypertension   . Lung cancer [facility])    pt son reports lung cancer then diagnosed with brain and bone metastisis.  . Lung mass 04/22/2016  . Malignant melanoma of arm ([place])    "left"  . Obesity   . PONV (postoperative nausea and vomiting)   . Type II diabetes mellitus [facility])     Patient  Active Problem List   Diagnosis Date Noted  . EGFR-related lung cancer ([place]) 05/29/2016  . Melanoma of skin ([place]) 05/15/2016  . UTI (urinary tract infection) 05/15/2016  . Protein-calorie malnutrition, severe 05/14/2016  . Pressure ulcer 05/14/2016  . Dehydration   . Primary lung cancer with metastasis from lung to other site [facility])   . Left leg weakness   . Hypovolemic shock ([place]) 05/13/2016  . Counseling regarding end of life decision making 05/12/2016  . Decubitus skin ulcer 05/12/2016  . Weight loss 05/12/2016  . Bone metastases ([place]) 05/11/2016  . Adenocarcinoma of left lung ([place]) 04/26/2016  . Acute urinary retention 04/26/2016  . AKI (acute kidney injury) ([place]) 04/26/2016  . Essential hypertension 04/22/2016  . Diabetes ([place]) 04/22/2016  . Adjustment disorder with mixed anxiety and depressed mood 04/22/2016  . Stomach ulcer 04/22/2016  . Gall bladder disease 04/22/2016  . History of back surgery 04/22/2016  . GERD (gastroesophageal reflux disease) 04/22/2016  . Lung mass 04/22/2016    Past Surgical History:  Procedure Laterality Date  . BACK SURGERY     "mid back; don't know what they did" (05/14/2016)  . BREAST BIOPSY Right   . BREAST LUMPECTOMY Right    "not cancer"  . CARDIAC CATHETERIZATION  12/11/2003  . CARPAL TUNNEL RELEASE Bilateral   . CATARACT  EXTRACTION W/ INTRAOCULAR LENS  IMPLANT, BILATERAL    . CHOLECYSTECTOMY OPEN    . DILATION AND CURETTAGE OF UTERUS    . MELANOMA EXCISION Left    "upper arm"  . TONSILLECTOMY  ~ 1944  . VAGINAL HYSTERECTOMY    . VIDEO BRONCHOSCOPY N/A 04/30/2016   Procedure: VIDEO BRONCHOSCOPY;  Surgeon: [name], MD;  Location: [place];  Service: Thoracic;  Laterality: N/A;    OB History    No data available     Home Medications    Prior to Admission medications   Medication Sig Start Date End Date Taking? Authorizing Provider  ALPRAZolam Duanne Moron) 1 MG tablet Take 1 tablet (1 mg total) by mouth at bedtime as needed  for anxiety. 05/01/16  Yes Norman Herrlich, MD  dexamethasone (DECADRON) 4 MG tablet Take 2 tablets (8 mg total) by mouth 3 (three) times daily. 07/02/16  Yes Patrici Ranks, MD  furosemide (LASIX) 40 MG tablet Take 40 mg by mouth daily.  06/24/16  Yes Historical Provider, MD  gefitinib (IRESSA) 250 MG tablet Take 1 tablet (250 mg total) by mouth daily. 07/12/16  Yes Manon Hilding Kefalas, PA-C  mirtazapine (REMERON) 15 MG tablet Take 1 tablet (15 mg total) by mouth at bedtime. 07/07/16  Yes Patrici Ranks, MD  oxyCODONE (OXY IR/ROXICODONE) 5 MG immediate release tablet Take 1 tablet (5 mg total) by mouth every 4 (four) hours as needed for severe pain. 05/01/16  Yes Norman Herrlich, MD  gabapentin (NEURONTIN) 400 MG capsule 2 capsules at bedtime. 05/28/16   Historical Provider, MD    Family History Family History  Problem Relation Age of Onset  . Cancer Mother   . Diabetes Mellitus II Son   . Cancer Brother     Social History Social History  Substance Use Topics  . Smoking status: Never Smoker  . Smokeless tobacco: Never Used  . Alcohol use No     Allergies   No known allergies   Review of Systems Review of Systems  Unable to perform ROS: Mental status change   Physical Exam Updated Vital Signs BP 187/94 (BP Location: Left Arm)   Pulse 82   Temp 97.9 F (36.6 C) (Oral)   Resp 18   Ht _0  (1.803 m)   Wt 230 lb (104.3 kg)   SpO2 94%   BMI 32.08 kg/m   Physical Exam  Constitutional: She appears well-developed and well-nourished.  Pleasant but confused.   HENT:  Head: Normocephalic and atraumatic.  Eyes: Conjunctivae are normal.  Neck: Neck supple.  Cardiovascular: Normal rate and regular rhythm.   Pulmonary/Chest: Effort normal and breath sounds normal.  Abdominal: Soft. Bowel sounds are normal.  Musculoskeletal: Normal range of motion.  Neurological: She is alert.  Oriented x1, is not oriented to place and time.   Skin: Skin is warm and dry.  Psychiatric: She  has a normal mood and affect. Her behavior is normal.  Nursing note and vitals reviewed.  ED Treatments / Results  Labs (all labs ordered are listed, but only abnormal results are displayed) Labs Reviewed  CBC WITH DIFFERENTIAL/PLATELET - Abnormal; Notable for the following:       Result Value   WBC 14.7 (*)    RDW 18.5 (*)    Neutro Abs 13.0 (*)    All other components within normal limits  COMPREHENSIVE METABOLIC PANEL - Abnormal; Notable for the following:    Chloride 94 (*)    Glucose, Bld  391 (*)    BUN 39 (*)    Total Protein 5.8 (*)    Albumin 3.0 (*)    ALT 59 (*)    Total Bilirubin 1.3 (*)    All other components within normal limits  URINALYSIS, ROUTINE W REFLEX MICROSCOPIC (NOT AT [facility]) - Abnormal; Notable for the following:    Color, Urine STRAW (*)    APPearance HAZY (*)    Glucose, UA >1000 (*)    Hgb urine dipstick MODERATE (*)    Leukocytes, UA SMALL (*)    All other components within normal limits  URINE MICROSCOPIC-ADD ON - Abnormal; Notable for the following:    Squamous Epithelial / LPF 6-30 (*)    Bacteria, UA FEW (*)    All other components within normal limits  URINE CULTURE    EKG  EKG Interpretation None       Radiology Ct Head Wo Contrast  Result Date: 07/14/2016 CLINICAL DATA:  Altered mental status. Lung cancer with metastatic disease to the brain. EXAM: CT HEAD WITHOUT CONTRAST TECHNIQUE: Contiguous axial images were obtained from the base of the skull through the vertex without intravenous contrast. COMPARISON:  MRI brain 07/06/2016 and 04/30/2016. FINDINGS: Brain: Innumerable calcified metastatic lesions are again noted. There is no acute hemorrhage or significant mass effect. Ventricles are normal size. No significant extra-axial fluid collection is present. No acute infarct is present. Mild atrophy and diffuse white matter disease is noted bilaterally. The basal ganglia are intact. Vascular: Cavernous internal carotid artery  calcifications are asymmetric on the right. There is no hyperdense vessel. Skull: Known lytic lesions are again seen within the calvarium. No new lesions are present. Sinuses/Orbits: The paranasal sinuses and mastoid air cells are clear. Bilateral lens replacements are noted. The globes and orbits are intact. IMPRESSION: 1. Innumerable punctate calcified lesions throughout the brain compatible metastatic disease. This may represent calcification following treatment. 2. No significant mass effect or large new lesions. 3. No acute intracranial abnormality. Electronically Signed   By: San Morelle M.D.   On: 07/14/2016 12:17   Dg Chest Port 1 View  Result Date: 07/14/2016 CLINICAL DATA:  Altered mental status. EXAM: PORTABLE CHEST 1 VIEW COMPARISON:  05/13/2016 FINDINGS: Heart is normal size. No confluent airspace opacities or effusions. No acute bony abnormality. IMPRESSION: No active disease. Electronically Signed   By: Rolm Baptise M.D.   On: 07/14/2016 13:50    Procedures Procedures (including critical care time)  Medications Ordered in ED Medications  cefTRIAXone (ROCEPHIN) 1 g in dextrose 5 % 50 mL IVPB (not administered)  sodium chloride 0.9 % bolus 1,000 mL (0 mLs Intravenous Stopped 07/14/16 1254)    DIAGNOSTIC STUDIES:  Oxygen Saturation is 94% on RA, normal by my interpretation.    COORDINATION OF CARE:  9:29 AM Discussed treatment plan with pt at bedside and pt agreed to plan.  Initial Impression / Assessment and Plan / ED Course  I have reviewed the triage vital signs and the nursing notes.  Pertinent labs & imaging results that were available during my care of the patient were reviewed by me and considered in my medical decision making (see chart for details).  Clinical Course     Plan for CT head, UA and labs. Possible admission to hospital.  Husband reports a change in her general physical well-being. She is more confused than usual. Medical history includes  metastatic lung cancer. Chest x-ray and head CT showed no acute findings. Urinalysis shows evidence of infection.  IV Rocephin, urine culture, admit.  I personally performed the services described in this documentation, which was scribed in my presence. The recorded information has been reviewed and is accurate.    Final Clinical Impressions(s) / ED Diagnoses   Final diagnoses:  Altered mental status, unspecified altered mental status type  Urinary tract infection without hematuria, site unspecified    New Prescriptions New Prescriptions   No medications on file     Nat Christen, MD 07/15/16 1718

## 2016-07-14 NOTE — ED Notes (Signed)
x3 unsuccessful attempts in pt LUE. Initial attempt in left forearm, second attempt left lateral forearm and final attempt left AC. Two lab tubes obtained from [facility] and IV site stop giving blood returned. IV site flushed, normal saline infusion started and IV infiltrated. Other ED staff and lab at bedside attempting to start IV and obtain blood. Pt tolerating well. nad noted.

## 2016-07-15 ENCOUNTER — Telehealth (HOSPITAL_COMMUNITY): Payer: Self-pay | Admitting: *Deleted

## 2016-07-15 ENCOUNTER — Encounter (HOSPITAL_COMMUNITY): Payer: Self-pay

## 2016-07-15 ENCOUNTER — Observation Stay (HOSPITAL_BASED_OUTPATIENT_CLINIC_OR_DEPARTMENT_OTHER)
Admission: EM | Admit: 2016-07-15 | Discharge: 2016-07-16 | Disposition: A | Discharge status: Skilled Nursing Facility | Payer: Medicare Other | Source: Home / Self Care | Attending: Emergency Medicine | Admitting: Emergency Medicine

## 2016-07-15 DIAGNOSIS — N390 Urinary tract infection, site not specified: Secondary | ICD-10-CM | POA: Diagnosis not present

## 2016-07-15 DIAGNOSIS — R410 Disorientation, unspecified: Secondary | ICD-10-CM

## 2016-07-15 DIAGNOSIS — I10 Essential (primary) hypertension: Secondary | ICD-10-CM

## 2016-07-15 DIAGNOSIS — M6281 Muscle weakness (generalized): Secondary | ICD-10-CM

## 2016-07-15 DIAGNOSIS — E118 Type 2 diabetes mellitus with unspecified complications: Secondary | ICD-10-CM | POA: Diagnosis not present

## 2016-07-15 DIAGNOSIS — C7931 Secondary malignant neoplasm of brain: Secondary | ICD-10-CM | POA: Diagnosis not present

## 2016-07-15 DIAGNOSIS — R531 Weakness: Secondary | ICD-10-CM

## 2016-07-15 DIAGNOSIS — G9340 Encephalopathy, unspecified: Secondary | ICD-10-CM | POA: Diagnosis not present

## 2016-07-15 DIAGNOSIS — Z515 Encounter for palliative care: Secondary | ICD-10-CM

## 2016-07-15 DIAGNOSIS — Y92009 Unspecified place in unspecified non-institutional (private) residence as the place of occurrence of the external cause: Secondary | ICD-10-CM

## 2016-07-15 DIAGNOSIS — R2689 Other abnormalities of gait and mobility: Secondary | ICD-10-CM

## 2016-07-15 DIAGNOSIS — C3490 Malignant neoplasm of unspecified part of unspecified bronchus or lung: Secondary | ICD-10-CM | POA: Diagnosis present

## 2016-07-15 DIAGNOSIS — W19XXXA Unspecified fall, initial encounter: Secondary | ICD-10-CM

## 2016-07-15 DIAGNOSIS — E860 Dehydration: Secondary | ICD-10-CM | POA: Diagnosis not present

## 2016-07-15 DIAGNOSIS — C3492 Malignant neoplasm of unspecified part of left bronchus or lung: Secondary | ICD-10-CM | POA: Diagnosis not present

## 2016-07-15 LAB — BASIC METABOLIC PANEL WITH GFR
Anion gap: 11 (ref 5–15)
BUN: 31 mg/dL — ABNORMAL HIGH (ref 6–20)
CO2: 28 mmol/L (ref 22–32)
Calcium: 8.1 mg/dL — ABNORMAL LOW (ref 8.9–10.3)
Chloride: 95 mmol/L — ABNORMAL LOW (ref 101–111)
Creatinine, Ser: 0.71 mg/dL (ref 0.44–1.00)
GFR calc Af Amer: >60 mL/min (ref >60)
GFR calc non Af Amer: >60 mL/min (ref >60)
Glucose, Bld: 332 mg/dL — ABNORMAL HIGH (ref 65–99)
Potassium: 3.8 mmol/L (ref 3.5–5.1)
Sodium: 134 mmol/L — ABNORMAL LOW (ref 135–145)

## 2016-07-15 LAB — COMPREHENSIVE METABOLIC PANEL
ALBUMIN: 2.7 g/dL — AB (ref 3.5–5.0)
ALT: 49 U/L (ref 14–54)
AST: 33 U/L (ref 15–41)
Alkaline Phosphatase: 109 U/L (ref 38–126)
Anion gap: 13 (ref 5–15)
BUN: 32 mg/dL — AB (ref 6–20)
CHLORIDE: 94 mmol/L — AB (ref 101–111)
CO2: 27 mmol/L (ref 22–32)
Calcium: 8.3 mg/dL — ABNORMAL LOW (ref 8.9–10.3)
Creatinine, Ser: 0.94 mg/dL (ref 0.44–1.00)
GFR calc Af Amer: >60 mL/min (ref >60)
GFR, EST NON AFRICAN AMERICAN: 56 mL/min — AB (ref >60)
GLUCOSE: 286 mg/dL — AB (ref 65–99)
POTASSIUM: 3.2 mmol/L — AB (ref 3.5–5.1)
SODIUM: 134 mmol/L — AB (ref 135–145)
Total Bilirubin: 0.5 mg/dL (ref 0.3–1.2)
Total Protein: 5.4 g/dL — ABNORMAL LOW (ref 6.5–8.1)

## 2016-07-15 LAB — CBC
HCT: 32.2 % — ABNORMAL LOW (ref 36.0–46.0)
Hemoglobin: 10.7 g/dL — ABNORMAL LOW (ref 12.0–15.0)
MCH: 29.2 pg (ref 26.0–34.0)
MCHC: 33.2 g/dL (ref 30.0–36.0)
MCV: 87.7 fL (ref 78.0–100.0)
PLATELETS: 123 10*3/uL — AB (ref 150–400)
RBC: 3.67 MIL/uL — ABNORMAL LOW (ref 3.87–5.11)
RDW: 18.3 % — AB (ref 11.5–15.5)
WBC: 9.4 10*3/uL (ref 4.0–10.5)

## 2016-07-15 LAB — CBC WITH DIFFERENTIAL/PLATELET
Basophils Absolute: 0 10*3/uL (ref 0.0–0.1)
Basophils Relative: 0 %
EOS PCT: 0 %
Eosinophils Absolute: 0 10*3/uL (ref 0.0–0.7)
HCT: 35.2 % — ABNORMAL LOW (ref 36.0–46.0)
HEMOGLOBIN: 11.9 g/dL — AB (ref 12.0–15.0)
LYMPHS ABS: 1 10*3/uL (ref 0.7–4.0)
LYMPHS PCT: 10 %
MCH: 29.4 pg (ref 26.0–34.0)
MCHC: 33.8 g/dL (ref 30.0–36.0)
MCV: 86.9 fL (ref 78.0–100.0)
MONOS PCT: 4 %
Monocytes Absolute: 0.4 10*3/uL (ref 0.1–1.0)
Neutro Abs: 7.8 10*3/uL — ABNORMAL HIGH (ref 1.7–7.7)
Neutrophils Relative %: 86 %
PLATELETS: 124 10*3/uL — AB (ref 150–400)
RBC: 4.05 MIL/uL (ref 3.87–5.11)
RDW: 18.1 % — ABNORMAL HIGH (ref 11.5–15.5)
WBC: 9.1 10*3/uL (ref 4.0–10.5)

## 2016-07-15 LAB — GLUCOSE, CAPILLARY
Glucose-Capillary: 314 mg/dL — ABNORMAL HIGH (ref 65–99)
Glucose-Capillary: 181 mg/dL — ABNORMAL HIGH (ref 65–99)

## 2016-07-15 MED ORDER — BLOOD GLUCOSE MONITOR KIT: PACK | 0 refills | Status: DC

## 2016-07-15 MED ORDER — DEXAMETHASONE 4 MG PO TABS: 4.0000 mg | ORAL_TABLET | Freq: Two times a day (BID) | ORAL | 1 refills | Status: AC

## 2016-07-15 MED ORDER — INSULIN STARTER KIT- PEN NEEDLES (ENGLISH)
1.0000 | Freq: Once | Status: AC
  Administered 2016-07-15: 1
  Filled 2016-07-15: qty 1

## 2016-07-15 MED ORDER — INSULIN PEN NEEDLE 29G X 10MM MISC: 0 refills | Status: DC

## 2016-07-15 MED ORDER — CIPROFLOXACIN HCL 250 MG PO TABS: 250.0000 mg | ORAL_TABLET | Freq: Two times a day (BID) | ORAL | 0 refills | Status: AC

## 2016-07-15 MED ORDER — INSULIN GLARGINE 100 UNIT/ML SOLOSTAR PEN: 15.0000 [IU] | PEN_INJECTOR | Freq: Every day | SUBCUTANEOUS | 11 refills | Status: DC

## 2016-07-15 NOTE — Consult Note (Signed)
   [facility] CM Inpatient Consult   07/15/2016  Christina Estes 08-21-1934 [id number]    [facility] Care Management referral received. Have made multiple attempts in trying to reach patient in her room. Spoke with inpatient RNCM initially to discuss referral was received. Called into [name]'s room on multiple occassions without success. Called nursing unit for assist. Unable to reach [name] via phone.  Left voicemail message for inpatient RNCM to make aware of above and to request assist in reaching patient and/or to advise who writer needs to contact regarding [place] Management services.    [name], MSN-Ed, RN,BSN [facility] Liaison [phone]

## 2016-07-15 NOTE — Consult Note (Addendum)
   [facility] CM Inpatient Consult   07/15/2016  Christina Estes 1935/01/10 [id number]   Telephone call received from inpatient RNCM to make aware that patient's son was is in the room now. States it was a good time to call into room again to discuss [place] Management services with patient's son.   Telephone call made into patient's room and there was no answer. Called patient's son Christina Estes at [phone]. Explained [place] [facility] services and program. Patient's son, Christina Estes, is agreeable and verbal consent was obtained. He reports that [name] lives with her husband who is disabled now and needs back surgery or injections or "something" per son. Christina Estes reports that he lives in [place] and will be here until Monday and return on Friday. States patient has limited support and he would appreciate any assistance they can receive. Christina Estes reports he can also be contacted if needed at [phone]. Verbal consent received and will request for [place] to be assigned. [name] is slated for discharge today and will also have home health per inpatient RNCM.  [name], MSN-Ed, RN,BSN [facility] Liaison [phone]

## 2016-07-15 NOTE — ED Triage Notes (Signed)
Pt states her family was assisting her from the chair and she apparently slid down onto the floor with the family's assistance.  Pt denies pain

## 2016-07-15 NOTE — Progress Notes (Signed)
Inpatient Diabetes Program Recommendations  AACE/ADA: New Consensus Statement on Inpatient Glycemic Control (2015)  Target Ranges:  Prepandial:   less than 140 mg/dL      Peak postprandial:   less than 180 mg/dL (1-2 hours)      Critically ill patients:  140 - 180 mg/dL  Results for Christina Estes, Christina Estes (MRN [id number]) as of 07/15/2016 10:50  Ref. Range 07/14/2016 21:09 07/15/2016 07:53  Glucose-Capillary Latest Ref Range: 65 - 99 mg/dL 310 (H) 314 (H)    Review of Glycemic Control  Diabetes history: DM2 Outpatient Diabetes medications: None Current orders for Inpatient glycemic control: Lantus 10 units QHS, Novolog 0-20 units TID with meals, Novolog 0-5 units QHS  Inpatient Diabetes Program Recommendations: Insulin - Basal: Please consider increasing Lantus to 15 units QHS (based on 98.9 kg x 0.15 units). Insulin - Meal Coverage: While inpatient and ordered steroids, please consider ordering Novolog 4 units TID with measl for meal coverage if patient eats at least 50% of meal.  NOTE: In reviewing the chart, noted patient has DM2 and was on Metformin at one time. Patient is taking Decadron as an outpatient and will likely need insulin as an outpatient if steroids are continued.  BEDSIDE NURSING will need to provide patient and family education on DM control and insulin administration. Will order insulin pen starter kit.   NURSING: Please use each patient interaction to provide diabetes education. Please have patient watch patient education videos on diabetes, and instruct on insulin administration. Please allow patient and family to be actively engaged with diabetes management by allowing patient to check own glucose and self-administer insulin injections.   Thanks, Barnie Alderman, RN, MSN, CDE Diabetes Coordinator Inpatient Diabetes Program [phone] (Team Pager from 8am to 5pm)

## 2016-07-15 NOTE — Care Management Obs Status (Signed)
[facility] NOTIFICATION   Patient Details  Name: Christina Estes MRN: [id number] Date of Birth: November 13, 1934   Medicare Observation Status Notification Given:  Other (see comment) (Pt discharged <24hrs)    Sherald Barge, RN 07/15/2016, 3:03 PM

## 2016-07-15 NOTE — ED Provider Notes (Signed)
[facility] DEPT Provider Note   CSN: [id number] Arrival date & time: 07/15/16  2226  By signing my name below, I, Higinio Plan, attest that this documentation has been prepared under the direction and in the presence of Rolland Porter, MD . Electronically Signed: Higinio Plan, Scribe. 07/15/2016. 11:39 PM.  Time seen 23:06 PM   History   Chief Complaint Chief Complaint  Patient presents with  . Fall   The history is provided by the patient and a relative. No language interpreter was used.   HPI Comments: CRYTAL PENSINGER is a 81 y.o. female with PMHx of brain cancer and Type II DM, who presents to the Emergency Department for an evaluation s/p a fall that occurred this evening. Per son, pt was recently admitted to the hospital for AMS two days ago and was discharged this afternoon at ~4:30 PM. He notes pt's mental status initally improved but before her discharge but she began "talking out of her head" before discharge and on arriving back home. He states pt "mistook his cell phone for a [employer] and tried to drink it" while driving her home. Per son, pt was sitting in her recliner this evening when she stood up with her walker he turned away to turn on a light and "slid down onto the floor." He notes she was recently given a walker to ambulate 2 months ago due to right leg weakness. Pt denies any head injury, loss of consciousness or acute pain now in the ED. Per son, pt has also been experiencing associated urinary frequency and incontinence and notes she had an episode of incontinence before and after her fall this evening. He reports pt's oncologist recently increased her steroids from 2 to 6 a day to "help her brain cancer." He notes pt was also placed on insulin for the first time yesterday because of hyperglycemia from the steroids. He states they never received any training about the insulin. He reports pt recently stopped hospice care 2 weeks ago. She has an appointment with her  oncologist, Dr. Whitney Muse tomorrow, Dec 1  The son lives in [place] and just comes to help his parents as he can. He states the patient's spouse has back problems and is supposed to have back surgery soon. He is questioning whether his mother can live at home anymore and is wanting her placed in a facility.  PCP: Dr. Pleas Koch   Past Medical History:  Diagnosis Date  . Anxiety   . Arthritis    "qwhere" (05/14/2016)  . Basal cell carcinoma    "arms, hands, face; some cut off/some burned off"  . Cancer ([place])    "I'm eat up w/cancer" (05/14/2016)  . Chronic back pain    "all over my back" (05/14/2016)  . Coronary artery disease   . Depression   . DOE (dyspnea on exertion)   . GERD (gastroesophageal reflux disease)   . History of blood transfusion    "when I was pregnant"  . History of hiatal hernia   . Hypercholesteremia   . Hypertension   . Lung cancer [facility])    pt son reports lung cancer then diagnosed with brain and bone metastisis.  . Lung mass 04/22/2016  . Malignant melanoma of arm ([place])    "left"  . Obesity   . PONV (postoperative nausea and vomiting)   . Type II diabetes mellitus [facility])     Patient Active Problem List   Diagnosis Date Noted  . Weakness 07/16/2016  . Encounter for  nursing home hospice care 07/16/2016  . Acute encephalopathy 07/14/2016  . Brain metastases ([place]) 07/14/2016  . EGFR-related lung cancer ([place]) 05/29/2016  . Melanoma of skin ([place]) 05/15/2016  . UTI (urinary tract infection) 05/15/2016  . Protein-calorie malnutrition, severe 05/14/2016  . Pressure ulcer 05/14/2016  . Dehydration   . Primary lung cancer with metastasis from lung to other site [facility])   . Left leg weakness   . Hypovolemic shock ([place]) 05/13/2016  . Counseling regarding end of life decision making 05/12/2016  . Decubitus skin ulcer 05/12/2016  . Weight loss 05/12/2016  . Bone metastases ([place]) 05/11/2016  . Adenocarcinoma of left lung ([place]) 04/26/2016  . Acute urinary retention  04/26/2016  . AKI (acute kidney injury) ([place]) 04/26/2016  . Essential hypertension 04/22/2016  . Diabetes ([place]) 04/22/2016  . Adjustment disorder with mixed anxiety and depressed mood 04/22/2016  . Stomach ulcer 04/22/2016  . Gall bladder disease 04/22/2016  . History of back surgery 04/22/2016  . GERD (gastroesophageal reflux disease) 04/22/2016  . Lung mass 04/22/2016    Past Surgical History:  Procedure Laterality Date  . BACK SURGERY     "mid back; don't know what they did" (05/14/2016)  . BREAST BIOPSY Right   . BREAST LUMPECTOMY Right    "not cancer"  . CARDIAC CATHETERIZATION  12/11/2003  . CARPAL TUNNEL RELEASE Bilateral   . CATARACT EXTRACTION W/ INTRAOCULAR LENS  IMPLANT, BILATERAL    . CHOLECYSTECTOMY OPEN    . DILATION AND CURETTAGE OF UTERUS    . MELANOMA EXCISION Left    "upper arm"  . TONSILLECTOMY  ~ 1944  . VAGINAL HYSTERECTOMY    . VIDEO BRONCHOSCOPY N/A 04/30/2016   Procedure: VIDEO BRONCHOSCOPY;  Surgeon: [name], MD;  Location: [facility];  Service: Thoracic;  Laterality: N/A;    OB History    No data available     Home Medications    Prior to Admission medications   Medication Sig Start Date End Date Taking? Authorizing Provider  ALPRAZolam Duanne Moron) 1 MG tablet Take 1 tablet (1 mg total) by mouth at bedtime as needed for anxiety. 05/01/16   Norman Herrlich, MD  blood glucose meter kit and supplies KIT Dispense based on patient and insurance preference. Use up to four times daily as directed. (FOR ICD-9 250.00, 250.01). 07/15/16   Kathie Dike, MD  ciprofloxacin (CIPRO) 250 MG tablet Take 1 tablet (250 mg total) by mouth 2 (two) times daily. 07/15/16   Kathie Dike, MD  dexamethasone (DECADRON) 4 MG tablet Take 1 tablet (4 mg total) by mouth 2 (two) times daily. 07/15/16   Kathie Dike, MD  furosemide (LASIX) 40 MG tablet Take 40 mg by mouth daily.  06/24/16   Historical Provider, MD  gabapentin (NEURONTIN) 400 MG capsule 2 capsules at bedtime.  05/28/16   Historical Provider, MD  gefitinib (IRESSA) 250 MG tablet Take 1 tablet (250 mg total) by mouth daily. 07/12/16   [name], PA-C  Insulin Glargine (LANTUS) 100 UNIT/ML Solostar Pen Inject 15 Units into the skin daily at 10 pm. 07/15/16   Kathie Dike, MD  Insulin Pen Needle 29G X 10MM MISC Use as directed 07/15/16   Kathie Dike, MD  mirtazapine (REMERON) 15 MG tablet Take 1 tablet (15 mg total) by mouth at bedtime. 07/07/16   Patrici Ranks, MD  oxyCODONE (OXY IR/ROXICODONE) 5 MG immediate release tablet Take 1 tablet (5 mg total) by mouth every 4 (four) hours as needed for severe  pain. 05/01/16   Norman Herrlich, MD    Family History Family History  Problem Relation Age of Onset  . Cancer Mother   . Diabetes Mellitus II Son   . Cancer Brother     Social History Social History  Substance Use Topics  . Smoking status: Never Smoker  . Smokeless tobacco: Never Used  . Alcohol use No  lives with spouse Lives at home  Allergies   No known allergies  Review of Systems Review of Systems  Genitourinary: Positive for frequency.  Neurological: Negative for syncope.  All other systems reviewed and are negative.  Physical Exam Updated Vital Signs ED Triage Vitals [07/16/16 0000]  Enc Vitals Group     BP 138/80     Pulse Rate 74     Resp      Temp      Temp src      SpO2 93 %     Weight      Height      Head Circumference      Peak Flow      Pain Score      Pain Loc      Pain Edu?      Excl. in [place]?    Vital signs normal    Physical Exam  Constitutional: She is oriented to person, place, and time. She appears well-developed and well-nourished.  Non-toxic appearance. She does not appear ill. No distress.  HENT:  Head: Normocephalic and atraumatic.  Right Ear: External ear normal.  Left Ear: External ear normal.  Nose: Nose normal. No mucosal edema or rhinorrhea.  Mouth/Throat: Mucous membranes are normal. No dental abscesses or uvula swelling.    Mucous membranes are dry.   Eyes: Conjunctivae and EOM are normal. Pupils are equal, round, and reactive to light.  Neck: Normal range of motion and full passive range of motion without pain. Neck supple.  Cardiovascular: Normal rate, regular rhythm and normal heart sounds.  Exam reveals no gallop and no friction rub.   No murmur heard. Pulmonary/Chest: Effort normal and breath sounds normal. No respiratory distress. She has no wheezes. She has no rhonchi. She has no rales. She exhibits no tenderness and no crepitus.  Abdominal: Soft. Normal appearance and bowel sounds are normal. She exhibits no distension. There is no tenderness. There is no rebound and no guarding.  Musculoskeletal: Normal range of motion. She exhibits no edema or tenderness.  Has a skin tear on right prox lateral leg  Neurological: She is alert and oriented to person, place, and time. She has normal strength. No cranial nerve deficit.  Skin: Skin is warm, dry and intact. No rash noted. No erythema. There is pallor.  Psychiatric: She has a normal mood and affect. Her speech is normal and behavior is normal. Her mood appears not anxious.  Nursing note and vitals reviewed.  ED Treatments / Results  Labs (all labs ordered are listed, but only abnormal results are displayed) Results for orders placed or performed during the hospital encounter of 07/15/16  Comprehensive metabolic panel  Result Value Ref Range   Sodium 134 (L) 135 - 145 mmol/L   Potassium 3.2 (L) 3.5 - 5.1 mmol/L   Chloride 94 (L) 101 - 111 mmol/L   CO2 27 22 - 32 mmol/L   Glucose, Bld 286 (H) 65 - 99 mg/dL   BUN 32 (H) 6 - 20 mg/dL   Creatinine, Ser 0.94 0.44 - 1.00 mg/dL   Calcium 8.3 (L) 8.9 -  10.3 mg/dL   Total Protein 5.4 (L) 6.5 - 8.1 g/dL   Albumin 2.7 (L) 3.5 - 5.0 g/dL   AST 33 15 - 41 U/L   ALT 49 14 - 54 U/L   Alkaline Phosphatase 109 38 - 126 U/L   Total Bilirubin 0.5 0.3 - 1.2 mg/dL   GFR calc non Af Amer 56 (L) >60 mL/min   GFR calc Af  Amer >60 >60 mL/min   Anion gap 13 5 - 15  CBC with Differential  Result Value Ref Range   WBC 9.1 4.0 - 10.5 K/uL   RBC 4.05 3.87 - 5.11 MIL/uL   Hemoglobin 11.9 (L) 12.0 - 15.0 g/dL   HCT 35.2 (L) 36.0 - 46.0 %   MCV 86.9 78.0 - 100.0 fL   MCH 29.4 26.0 - 34.0 pg   MCHC 33.8 30.0 - 36.0 g/dL   RDW 18.1 (H) 11.5 - 15.5 %   Platelets 124 (L) 150 - 400 K/uL   Neutrophils Relative % 86 %   Neutro Abs 7.8 (H) 1.7 - 7.7 K/uL   Lymphocytes Relative 10 %   Lymphs Abs 1.0 0.7 - 4.0 K/uL   Monocytes Relative 4 %   Monocytes Absolute 0.4 0.1 - 1.0 K/uL   Eosinophils Relative 0 %   Eosinophils Absolute 0.0 0.0 - 0.7 K/uL   Basophils Relative 0 %   Basophils Absolute 0.0 0.0 - 0.1 K/uL  Urinalysis, Routine w reflex microscopic  Result Value Ref Range   Color, Urine YELLOW YELLOW   APPearance CLEAR CLEAR   Specific Gravity, Urine 1.025 1.005 - 1.030   pH 5.0 5.0 - 8.0   Glucose, UA NEGATIVE NEGATIVE mg/dL   Hgb urine dipstick LARGE (A) NEGATIVE   Bilirubin Urine NEGATIVE NEGATIVE   Ketones, ur NEGATIVE NEGATIVE mg/dL   Protein, ur NEGATIVE NEGATIVE mg/dL   Nitrite NEGATIVE NEGATIVE   Leukocytes, UA NEGATIVE NEGATIVE  Urine microscopic-add on  Result Value Ref Range   Squamous Epithelial / LPF 0-5 (A) NONE SEEN   WBC, UA 0-5 0 - 5 WBC/hpf   RBC / HPF TOO NUMEROUS TO COUNT 0 - 5 RBC/hpf   Bacteria, UA MANY (A) NONE SEEN   Urine-Other MUCOUS PRESENT    Laboratory interpretation all normal except hematuria (cath specimen), mild anemia, hypoglycemia, malnutrition    EKG  EKG Interpretation None       Radiology Ct Head Wo Contrast  Result Date: 07/14/2016 CLINICAL DATA:  Altered mental status. Lung cancer with metastatic disease to the brain. IMPRESSION: 1. Innumerable punctate calcified lesions throughout the brain compatible metastatic disease. This may represent calcification following treatment. 2. No significant mass effect or large new lesions. 3. No acute intracranial  abnormality. Electronically Signed   By: San Morelle M.D.   On: 07/14/2016 12:17   Dg Chest Port 1 View  Result Date: 07/14/2016 CLINICAL DATA:  Altered mental status. IMPRESSION: No active disease. Electronically Signed   By: Rolm Baptise M.D.   On: 07/14/2016 13:50    Procedures Procedures (including critical care time)  Medications Ordered in ED Medications - No data to display  Initial Impression / Assessment and Plan / ED Course  I have reviewed the triage vital signs and the nursing notes.  Pertinent labs & imaging results that were available during my care of the patient were reviewed by me and considered in my medical decision making (see chart for details).  Clinical Course   DIAGNOSTIC STUDIES:  Oxygen Saturation  is 93% on RA, adequate by my interpretation.    COORDINATION OF CARE:  11:17 PM Discussed treatment plan with pt and son at bedside and pt agreed to plan. We discussed readmitting her for placement  12:38 AM Dr Shanon Brow, hospitalist, will admit to observation, med-surg for facility placement.  12:41 AM Informed pt and her son of admission to the hospital.    Final Clinical Impressions(s) / ED Diagnoses   Final diagnoses:  Weakness  Confusion  Fall at home, initial encounter    Plan admission  Rolland Porter, MD, Barbette Or, MD 07/16/16 0111

## 2016-07-15 NOTE — Discharge Summary (Signed)
Physician Discharge Summary  Christina Estes STM:[id number] DOB: 1935-04-26 DOA: 07/14/2016  PCP: [name], MD  Admit date: 07/14/2016 Discharge date: 07/15/2016  Admitted From: home Disposition:  home  Recommendations for Outpatient Follow-up:  1. Follow up with PCP in 1-2 weeks For further management of blood sugars 2. Follow-up with Dr. Whitney Muse for further management of lung cancer.  Home Health: Equipment/Devices:  Discharge Condition: stable CODE STATUS: DNR Diet recommendation: Heart Healthy / Carb Modified  Brief/Interim Summary: 81 y.o. 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 Patient's dexamethasone dose was recently increased from 55m twice a day to 872mthree times a day. While in the ED, vital signs were stable but WBC was mildly elevated at 14,000. Glucose was elevated at 391. Urine analysis indicated a possible infection. Chest x-ray and CT head were unremarkable. Patient was given a dose of Rocephin and IV fluids. Patient was admitted for further evaluation of confusion.  Discharge Diagnoses:  Active Problems:   Essential hypertension   Diabetes ([facility]  Adenocarcinoma of left lung (HCJane  Dehydration   UTI (urinary tract infection)   Acute encephalopathy   Brain metastases (HCAkron 1. Acute encephalopathy. Possibly related to urinary tract infection. May also be related to increased dosage of steroids and dehydration. Since admission, her mental status has improved to baseline. Will decrease dose of steroids to 4 mg twice a day.  2. Possible urinary tract infection. Started on Rocephin. Urine culture pending at the time of discharge. Will empirically treat with ciprofloxacin.. 3. Stage IV lung cancer with brain metastases. Follow-up with oncology. Discussed case with Dr. PeWhitney Museill arrange further follow-up. Continue on steroids, although at a lower  dose. 4. Diabetes. Patient was started on Lantus insulin. Hemoglobin A1c pending at discharge. This can be further adjusted as an outpatient.. 5. HTN. Pressures are stable. Continue to monitor. 6. GERD. Noted.  Discharge Instructions  Discharge Instructions    AMB Referral to TH[facility]elle[facility]anagement    Complete by:  As directed    Please assign to [place] transition of care and for DM management and education. Verbal consent obtained. [facility] Will have home health as well. Please call with questions. Thanks. AtMarthenia RollingMSAvalonRN,BSN-THN CaInter[facility] HospitaliGXQJJHE-[phone] Reason for consult:  Please assign to [facility]   Diagnoses of:  Diabetes   Expected date of contact:  1-3 days (reserved for hospital discharges)   Diet - low sodium heart healthy    Complete by:  As directed    Increase activity slowly    Complete by:  As directed        Medication List    TAKE these medications   ALPRAZolam 1 MG tablet Commonly known as:  XANAX Take 1 tablet (1 mg total) by mouth at bedtime as needed for anxiety.   blood glucose meter kit and supplies Kit Dispense based on patient and insurance preference. Use up to four times daily as directed. (FOR ICD-9 250.00, 250.01).   ciprofloxacin 250 MG tablet Commonly known as:  CIPRO Take 1 tablet (250 mg total) by mouth 2 (two) times daily.   dexamethasone 4 MG tablet Commonly known as:  DECADRON Take 1 tablet (4 mg total) by mouth 2 (two) times daily. What changed:  how much to take  when to take this  furosemide 40 MG tablet Commonly known as:  LASIX Take 40 mg by mouth daily.   gabapentin 400 MG capsule Commonly known as:  NEURONTIN 2 capsules at bedtime.   gefitinib 250 MG tablet Commonly known as:  IRESSA Take 1 tablet (250 mg total) by mouth daily.   Insulin Glargine 100 UNIT/ML Solostar Pen Commonly known as:  LANTUS Inject 15 Units into  the skin daily at 10 pm.   Insulin Pen Needle 29G X 10MM Misc Use as directed   mirtazapine 15 MG tablet Commonly known as:  REMERON Take 1 tablet (15 mg total) by mouth at bedtime.   oxyCODONE 5 MG immediate release tablet Commonly known as:  Oxy IR/ROXICODONE Take 1 tablet (5 mg total) by mouth every 4 (four) hours as needed for severe pain.      Follow-up Information    Molli Hazard, MD Follow up on 07/23/2016.   Specialties:  Hematology and Oncology, Oncology Why:  1:20pm Contact information: [place] [address] [place] Follow up.   Contact information: [place] [address] [phone]          Allergies  Allergen Reactions  . No Known Allergies     Consultations:     Procedures/Studies: Ct Head Wo Contrast  Result Date: 07/14/2016 CLINICAL DATA:  Altered mental status. Lung cancer with metastatic disease to the brain. EXAM: CT HEAD WITHOUT CONTRAST TECHNIQUE: Contiguous axial images were obtained from the base of the skull through the vertex without intravenous contrast. COMPARISON:  MRI brain 07/06/2016 and 04/30/2016. FINDINGS: Brain: Innumerable calcified metastatic lesions are again noted. There is no acute hemorrhage or significant mass effect. Ventricles are normal size. No significant extra-axial fluid collection is present. No acute infarct is present. Mild atrophy and diffuse white matter disease is noted bilaterally. The basal ganglia are intact. Vascular: Cavernous internal carotid artery calcifications are asymmetric on the right. There is no hyperdense vessel. Skull: Known lytic lesions are again seen within the calvarium. No new lesions are present. Sinuses/Orbits: The paranasal sinuses and mastoid air cells are clear. Bilateral lens replacements are noted. The globes and orbits are intact. IMPRESSION: 1. Innumerable punctate calcified lesions throughout the  brain compatible metastatic disease. This may represent calcification following treatment. 2. No significant mass effect or large new lesions. 3. No acute intracranial abnormality. Electronically Signed   By: San Morelle M.D.   On: 07/14/2016 12:17   Ct Chest W Contrast  Result Date: 07/07/2016 CLINICAL DATA:  Adenocarcinoma of the left lung diagnosed 9/17. Radiation therapy. Restaging. Brain metastasis. EXAM: CT CHEST WITH CONTRAST TECHNIQUE: Multidetector CT imaging of the chest was performed during intravenous contrast administration. CONTRAST:  38m ISOVUE-300 IOPAMIDOL (ISOVUE-300) INJECTION 61% COMPARISON:  Chest radiograph 05/13/2016.  CT of 04/20/2016 FINDINGS: Cardiovascular: Aortic and branch vessel atherosclerosis. Tortuous thoracic aorta. Mild cardiomegaly, without pericardial effusion. Multivessel coronary artery atherosclerosis. No central pulmonary embolism, on this non-dedicated study. Mediastinum/Nodes: No supraclavicular adenopathy. No mediastinal or hilar adenopathy. Lungs/Pleura: No pleural fluid. Subpleural right lower lobe vague 5 mm nodule on image 83/ series 3 is similar. More cephalad right lower lobe 6 mm subpleural nodule on image 70/series 3 is also not significantly changed. Minimal left upper lobe nodularity on image 59/series 3 is unchanged. Superior segment left lower lobe lung mass measures 3.5 x 3.0 cm on image 78/series 3. Compare 5.0 x 4.2 cm on the prior. Minimal more lateral  superior segment left lower lobe nodularity is likely related to the same process and at the site of the dominant mass on the prior. Upper Abdomen: Possible irregular hepatic capsule, including on image 169/series 2. Normal imaged portions of the spleen, stomach, pancreas, adrenal glands, left kidney. Aortic and branch vessel atherosclerosis. Musculoskeletal: Development of multi focal sclerotic osseous metastasis. Example in the right humeral head at 1.8 cm. Compression deformity at T4  persists. There is a superior endplate compression deformity at L1 which is new. IMPRESSION: 1. Since the CT of 04/20/2016, decreased size of a superior segment left lower lobe lung mass. 2. No thoracic adenopathy. 3. Other smaller pulmonary nodules are nonspecific and unchanged. 4. Widespread osseous metastasis, new since 04/20/2016 CT but described and detailed on the MRI of 04/26/2016. 5.  Coronary artery atherosclerosis. Aortic atherosclerosis. 6. Possible mild cirrhosis.  Correlate with risk factors. Electronically Signed   By: Abigail Miyamoto M.D.   On: 07/07/2016 09:44   Mr Jeri Cos XH Contrast  Result Date: 07/06/2016 CLINICAL DATA:  81 year old female with confusion and memory loss for 3 weeks. Left lung adenocarcinoma with extensive metastatic disease to the brain demonstrated in September. Per the medical record the patient may have had some radiation treatment since brain metastases were diagnosed, but not the full course. Patient opted for Hospice. Subsequent encounter. EXAM: MRI HEAD WITHOUT AND WITH CONTRAST TECHNIQUE: Multiplanar, multiecho pulse sequences of the brain and surrounding structures were obtained without and with intravenous contrast. CONTRAST:  41m MULTIHANCE GADOBENATE DIMEGLUMINE 529 MG/ML IV SOLN COMPARISON:  Brain MRI 04/30/2016. FINDINGS: Brain: Innumerable sub-centimeter enhancing brain metastases re- demonstrated. In general all of the largest brain metastases appear slightly smaller than on 04/26/2016. No areas of significant edema. No intracranial mass effect. Osseous metastatic disease involving the calvarium with mild associated dural thickening (e.g. Right parietal convexity series 12, image 57). The skull osseous metastases also have not significantly progressed. There is no leptomeningeal metastasis identified. No restricted diffusion to suggest acute infarction. No midline shift, mass effect, ventriculomegaly, extra-axial fluid collection or acute intracranial  hemorrhage. Cervicomedullary junction and pituitary are within normal limits. Vascular: Major intracranial vascular flow voids are stable and within normal limits. Skull and upper cervical spine: Grossly negative visualized cervical spinal cord. Occipital condyles and visible upper cervical spine bone marrow signal remains normal. Sinuses/Orbits: Stable and negative. Other: Visible internal auditory structures appear normal. Negative scalp soft tissues. IMPRESSION: 1. Innumerable subcentimeter brain metastases without progression since 04/30/2016. Many of the dominant lesions appear slightly smaller now suggesting some response to treatment. No significant cerebral edema. No intracranial mass effect. 2. Several skull metastases also not significantly progressed since September. The largest of these in the right parietal bone appears to have some underlying dural involvement. 3. No new intracranial abnormality. Electronically Signed   By: HGenevie AnnM.D.   On: 07/06/2016 20:18   Dg Chest Port 1 View  Result Date: 07/14/2016 CLINICAL DATA:  Altered mental status. EXAM: PORTABLE CHEST 1 VIEW COMPARISON:  05/13/2016 FINDINGS: Heart is normal size. No confluent airspace opacities or effusions. No acute bony abnormality. IMPRESSION: No active disease. Electronically Signed   By: KRolm BaptiseM.D.   On: 07/14/2016 13:50       Subjective: Patient awake, alert, denies any shortness of breath. Denies shortness of breath. Denies any complaints  Discharge Exam: Vitals:   07/15/16 0700 07/15/16 1300  BP: 138/64 (!) 148/63  Pulse: 66 81  Resp: 20 20  Temp: 97.6 F (36.4  C) 97.4 F (36.3 C)   Vitals:   07/14/16 1929 07/14/16 2103 07/15/16 0700 07/15/16 1300  BP: (!) 140/56 (!) 150/65 138/64 (!) 148/63  Pulse: 88 69 66 81  Resp: _0 Temp: 98.3 F (36.8 C) 98.1 F (36.7 C) 97.6 F (36.4 C) 97.4 F (36.3 C)  TempSrc: Oral Oral Oral Oral  SpO2: 97% 97% 96% 95%  Weight:      Height:         General: Pt is alert, awake, not in acute distress Cardiovascular: RRR, S1/S2 +, no rubs, no gallops Respiratory: CTA bilaterally, no wheezing, no rhonchi Abdominal: Soft, NT, ND, bowel sounds + Extremities: no edema, no cyanosis    The results of significant diagnostics from this hospitalization (including imaging, microbiology, ancillary and laboratory) are listed below for reference.     Microbiology: No results found for this or any previous visit (from the past 240 hour(s)).   Labs: BNP (last 3 results) No results for input(s): BNP in the last 8760 hours. Basic Metabolic Panel:  Recent Labs Lab 07/14/16 1027 07/15/16 0532  NA 135 134*  K 4.5 3.8  CL 94* 95*  CO2 26 28  GLUCOSE 391* 332*  BUN 39* 31*  CREATININE 0.83 0.71  CALCIUM 8.9 8.1*   Liver Function Tests:  Recent Labs Lab 07/14/16 1027  AST 39  ALT 59*  ALKPHOS 116  BILITOT 1.3*  PROT 5.8*  ALBUMIN 3.0*   No results for input(s): LIPASE, AMYLASE in the last 168 hours. No results for input(s): AMMONIA in the last 168 hours. CBC:  Recent Labs Lab 07/14/16 1027 07/15/16 0532  WBC 14.7* 9.4  NEUTROABS 13.0*  --   HGB 13.0 10.7*  HCT 38.1 32.2*  MCV 86.6 87.7  PLT 150 123*   Cardiac Enzymes: No results for input(s): CKTOTAL, CKMB, CKMBINDEX, TROPONINI in the last 168 hours. BNP: Invalid input(s): POCBNP CBG:  Recent Labs Lab 07/14/16 2109 07/15/16 0753 07/15/16 1113  GLUCAP 310* 314* 181*   D-Dimer No results for input(s): DDIMER in the last 72 hours. Hgb A1c No results for input(s): HGBA1C in the last 72 hours. Lipid Profile No results for input(s): CHOL, HDL, LDLCALC, TRIG, CHOLHDL, LDLDIRECT in the last 72 hours. Thyroid function studies No results for input(s): TSH, T4TOTAL, T3FREE, THYROIDAB in the last 72 hours.  Invalid input(s): FREET3 Anemia work up No results for input(s): VITAMINB12, FOLATE, FERRITIN, TIBC, IRON, RETICCTPCT in the last 72 hours. Urinalysis     Component Value Date/Time   COLORURINE STRAW (A) 07/14/2016 1006   APPEARANCEUR HAZY (A) 07/14/2016 1006   LABSPEC 1.020 07/14/2016 1006   PHURINE 6.0 07/14/2016 1006   GLUCOSEU >1000 (A) 07/14/2016 1006   HGBUR MODERATE (A) 07/14/2016 1006   BILIRUBINUR NEGATIVE 07/14/2016 1006   KETONESUR NEGATIVE 07/14/2016 1006   PROTEINUR NEGATIVE 07/14/2016 1006   NITRITE NEGATIVE 07/14/2016 1006   LEUKOCYTESUR SMALL (A) 07/14/2016 1006   Sepsis Labs Invalid input(s): PROCALCITONIN,  WBC,  LACTICIDVEN Microbiology No results found for this or any previous visit (from the past 240 hour(s)).   Time coordinating discharge: Over 30 minutes  SIGNED:   Kathie Dike, MD  [facility] 07/15/2016, 6:51 PM Pager   If 7PM-7AM, please contact night-coverage [email or web address] Password [email or web address]

## 2016-07-15 NOTE — Care Management Note (Signed)
Case Management Note  Patient Details  Name: Christina Estes MRN: [id number] Date of Birth: 1935/06/13  Subjective/Objective:                  Pt admitted with UTI. She is from home, lives with husband and and has son at bedside. Pt is active with [facility] for nursing, PT, aid services. She uses RW for ambulation. She has PCP, transportation and no difficulty affording medications. Romualdo Bolk, of [facility] aware of admission and DC home today. She plans to return home with resumption of [place] services. Pt in observation and does not need order to resume services.   Action/Plan: Discharging home today.   Expected Discharge Date:     07/15/2016             Expected Discharge Plan:  [place]  In-House Referral:  NA  Discharge planning Services  CM Consult  Post Acute Care Choice:  Home Health, Resumption of Svcs/PTA Provider Choice offered to:  Patient  HH Arranged:  RN, PT, Nurse's Aide [place] Agency:  [place]  Status of Service:  Completed, signed off  Sherald Barge, RN 07/15/2016, 3:03 PM

## 2016-07-16 ENCOUNTER — Telehealth (HOSPITAL_COMMUNITY): Payer: Self-pay | Admitting: Emergency Medicine

## 2016-07-16 ENCOUNTER — Ambulatory Visit (HOSPITAL_COMMUNITY): Payer: Commercial Managed Care - HMO | Admitting: Oncology

## 2016-07-16 ENCOUNTER — Other Ambulatory Visit: Payer: Self-pay | Admitting: *Deleted

## 2016-07-16 ENCOUNTER — Encounter (HOSPITAL_COMMUNITY): Payer: Self-pay

## 2016-07-16 DIAGNOSIS — C3490 Malignant neoplasm of unspecified part of unspecified bronchus or lung: Secondary | ICD-10-CM | POA: Diagnosis not present

## 2016-07-16 DIAGNOSIS — R531 Weakness: Secondary | ICD-10-CM

## 2016-07-16 DIAGNOSIS — R410 Disorientation, unspecified: Secondary | ICD-10-CM | POA: Diagnosis not present

## 2016-07-16 DIAGNOSIS — Z515 Encounter for palliative care: Secondary | ICD-10-CM | POA: Diagnosis not present

## 2016-07-16 DIAGNOSIS — C7931 Secondary malignant neoplasm of brain: Secondary | ICD-10-CM | POA: Diagnosis not present

## 2016-07-16 LAB — GLUCOSE, CAPILLARY
GLUCOSE-CAPILLARY: 308 mg/dL — AB (ref 65–99)
GLUCOSE-CAPILLARY: 128 mg/dL — AB (ref 65–99)
GLUCOSE-CAPILLARY: 326 mg/dL — AB (ref 65–99)

## 2016-07-16 LAB — URINE CULTURE

## 2016-07-16 LAB — URINALYSIS, ROUTINE W REFLEX MICROSCOPIC
BILIRUBIN URINE: NEGATIVE
Glucose, UA: NEGATIVE mg/dL
Ketones, ur: NEGATIVE mg/dL
LEUKOCYTES UA: NEGATIVE
NITRITE: NEGATIVE
PH: 5 (ref 5.0–8.0)
Protein, ur: NEGATIVE mg/dL
SPECIFIC GRAVITY, URINE: 1.025 (ref 1.005–1.030)

## 2016-07-16 LAB — HEMOGLOBIN A1C
Hgb A1c MFr Bld: 8.4 % — ABNORMAL HIGH (ref 4.8–5.6)
Mean Plasma Glucose: 194 mg/dL

## 2016-07-16 LAB — URINE MICROSCOPIC-ADD ON

## 2016-07-16 MED ORDER — FLUCONAZOLE 100 MG PO TABS
100.0000 mg | ORAL_TABLET | Freq: Every day | ORAL | Status: DC
  Administered 2016-07-16: 100 mg via ORAL
  Filled 2016-07-16: qty 1

## 2016-07-16 MED ORDER — DEXAMETHASONE 4 MG PO TABS
4.0000 mg | ORAL_TABLET | Freq: Two times a day (BID) | ORAL | Status: DC
  Administered 2016-07-16: 4 mg via ORAL
  Filled 2016-07-16 (×7): qty 1

## 2016-07-16 MED ORDER — CIPROFLOXACIN HCL 250 MG PO TABS
250.0000 mg | ORAL_TABLET | Freq: Two times a day (BID) | ORAL | Status: DC
  Administered 2016-07-16: 250 mg via ORAL
  Filled 2016-07-16: qty 1

## 2016-07-16 MED ORDER — FLUCONAZOLE 100 MG PO TABS: 100.0000 mg | ORAL_TABLET | Freq: Every day | ORAL | 0 refills | Status: AC

## 2016-07-16 MED ORDER — NYSTATIN 100000 UNIT/GM EX POWD
Freq: Three times a day (TID) | CUTANEOUS | Status: DC
  Administered 2016-07-16: 18:00:00 via TOPICAL
  Filled 2016-07-16: qty 15

## 2016-07-16 MED ORDER — SODIUM CHLORIDE 0.9 % IV SOLN: 250.0000 mL | INTRAVENOUS | Status: DC | PRN

## 2016-07-16 MED ORDER — FUROSEMIDE 40 MG PO TABS
40.0000 mg | ORAL_TABLET | Freq: Every day | ORAL | Status: DC
  Administered 2016-07-16: 40 mg via ORAL
  Filled 2016-07-16: qty 1

## 2016-07-16 MED ORDER — DEXAMETHASONE 4 MG PO TABS
ORAL_TABLET | ORAL | Status: AC
  Filled 2016-07-16: qty 1

## 2016-07-16 MED ORDER — SODIUM CHLORIDE 0.9% FLUSH
3.0000 mL | Freq: Two times a day (BID) | INTRAVENOUS | Status: DC
  Administered 2016-07-16 (×2): 3 mL via INTRAVENOUS

## 2016-07-16 MED ORDER — INSULIN ASPART 100 UNIT/ML ~~LOC~~ SOLN
0.0000 [IU] | Freq: Three times a day (TID) | SUBCUTANEOUS | Status: DC
  Administered 2016-07-16: 1 [IU] via SUBCUTANEOUS
  Administered 2016-07-16 (×2): 7 [IU] via SUBCUTANEOUS

## 2016-07-16 MED ORDER — ALPRAZOLAM 1 MG PO TABS: 1.0000 mg | ORAL_TABLET | Freq: Every evening | ORAL | 0 refills | Status: AC | PRN

## 2016-07-16 MED ORDER — SODIUM CHLORIDE 0.9% FLUSH: 3.0000 mL | INTRAVENOUS | Status: DC | PRN

## 2016-07-16 MED ORDER — NYSTATIN 100000 UNIT/GM EX POWD: Freq: Three times a day (TID) | CUTANEOUS | 0 refills | Status: AC

## 2016-07-16 MED ORDER — OXYCODONE HCL 5 MG PO TABS: 5.0000 mg | ORAL_TABLET | ORAL | 0 refills | Status: AC | PRN

## 2016-07-16 NOTE — Clinical Social Work Note (Signed)
Clinical Social Work Assessment  Patient Details  Name: Christina Estes MRN: [id number] Date of Birth: [birth year]  Date of referral:  07/16/16               Reason for consult:  Discharge Planning                Permission sought to share information with:    Permission granted to share information::     Name::        Agency::     Relationship::     Contact Information:  Milas Gain, son  Housing/Transportation Living arrangements for the past 2 months:  Single Family Home Source of Information:  Adult Children Patient Interpreter Needed:  None Criminal Activity/Legal Involvement Pertinent to Current Situation/Hospitalization:  No - Comment as needed Significant Relationships:  Adult Children, Spouse Lives with:  Spouse Do you feel safe going back to the place where you live?  Yes Need for family participation in patient care:  Yes (Comment)  Care giving concerns:  Patient lives at home with her husband.  Husband is elderly with health issues and is not able to provide patient with the level of care needed per son.   Social Worker assessment / plan:  Patient uses a walker and wheelchair. Patient is unable to complete ADLs independently. Patient has had recurrent falls. Mr. Nicki Reaper stated that if Dr. Whitney Muse recommended it, he would agree to [facility] as he trust her judgement. CSW discussed PT recommendation, Mr. Nicki Reaper was agreeable and stated that he would choose SNF if Dr. Whitney Muse did not recommend Hospice Home.   Employment status:  Retired Nurse, adult PT Recommendations:  [place] / Referral to community resources:  [place]  Patient/Family's Response to care:  Son is agreeable for patient to go to SNF.  Patient/Family's Understanding of and Emotional Response to Diagnosis, Current Treatment, and Prognosis:  Patient and family understand patient's diagnosis, treatment and prognosis.   Emotional  Assessment Appearance:  Appears stated age Attitude/Demeanor/Rapport:   (Cooperative/pleasant) Affect (typically observed):  Accepting Orientation:  Oriented to Self, Oriented to Place, Oriented to  Time Alcohol / Substance use:  Not Applicable Psych involvement (Current and /or in the community):  No (Comment)  Discharge Needs  Concerns to be addressed:  Discharge Planning Concerns Readmission within the last 30 days:  Yes Current discharge risk:  None Barriers to Discharge:  No Barriers Identified   Ihor Gully, LCSW 07/16/2016, 12:57 PM

## 2016-07-16 NOTE — Care Management Note (Signed)
Case Management Note  Patient Details  Name: Christina Estes MRN: [id number] Date of Birth: 13-Aug-1935    Expected Discharge Date:   07/16/2016               Expected Discharge Plan:  [place]  In-House Referral:  Clinical Social Work  Discharge planning Services  CM Consult  Post Acute Care Choice:  NA Choice offered to:  NA  DME Arranged:    DME Agency:     HH Arranged:    [place] Agency:     Status of Service:  Completed, signed off  If discussed at [employer], dates discussed:    Additional Comments: Patient discharging today. CSW making arrangements. No CM needs.  Marshell Rieger, Chauncey [place], RN 07/16/2016, 2:30 PM

## 2016-07-16 NOTE — Discharge Summary (Addendum)
Physician Discharge Summary  Christina Estes OFB:[id number] DOB: [birth year] DOA: 07/15/2016  PCP: [name], MD  Admit date: 07/15/2016 Discharge date: 07/16/2016  Admitted From:  Disposition:  SNF  Recommendations for Outpatient Follow-up:  1. Follow up with PCP in 1-2 weeks 2. Follow up with oncology as scheduled 3. Patient will discharge to SNF for short term rehab. Family will consider hospice based on her progression. They will inform facility if they wish to pursue hospice  Home Health: Equipment/Devices:  Discharge Condition: stable CODE STATUS: DNR Diet recommendation: Heart Healthy / Carb Modified   Brief/Interim Summary: Christina Estes is a 81 y.o. female with medical history significant of lung cancer with mets to brain, recent uti, HTN, just d/c earlier today to home with son by dr Roderic Palau after being treated for AMS and uti comes back in for generalized weakness, and fall today at home.  Son is interested in SNF placement as he does not think he can take care of her at home, this is report from dr knapp in ED.  Pt referred for admission for placement  Discharge Diagnoses:  Principal Problem:   Encounter for nursing home hospice care Active Problems:   Primary lung cancer with metastasis from lung to other site [facility] [address])   Brain metastases ([place])   Weakness  1. Lung cancer with brain metastases. Patient will follow up with oncology for further management. Continue on dexamethasone.  2. Diabetes. Hemoglobin A1c 8.4. Blood sugars have been running higher in the setting of steroids. She's been started on Lantus. Contrary to what was documented in the ER record, patient and her son were educated regarding administration of insulin prior to her last discharge. Her son demonstrated the ability to inject insulin. Will continue on current dose of Lantus. Further adjustment to be made as an outpatient.  3. Acute encephalopathy. Mental status appears to be waxing and waning  with periods of clarity followed by periods of confusion. Possibly related to recent dehydration/ urinary tract infection. Continue to monitor. At this time, she is awake, alert and answering questions appropriately. This may also be related to brain metastases.  4. Recent urinary tract infection. Continue on ciprofloxacin  5. Generalized weakness. Patient had a fall at home prior to readmission. Seen by physical therapy and recommended skilled nursing facility placement.  6. Rash in skin folds. Patient started on nystatin and fluconazole. Likely related to recent steroid use   Discharge Instructions  Discharge Instructions    AMB Referral to [facility]    Complete by:  As directed    Please assign to [facility] social worker. Verbal consent was received as patient is at [facility]. Discharge plan is for SNF. Will update on SNF facility once known. Was already assigned to [place]. THanks. [name], [place], RN,BSN-THN [facility] POEUMPN-[phone]   Reason for consult:  Please assign to [facility] LCSW   Expected date of contact:  1-3 days (reserved for hospital discharges)   Diet - low sodium heart healthy    Complete by:  As directed    Increase activity slowly    Complete by:  As directed        Medication List    TAKE these medications   ALPRAZolam 1 MG tablet Commonly known as:  XANAX Take 1 tablet (1 mg total) by mouth at bedtime as needed for anxiety.   ciprofloxacin 250 MG tablet Commonly known as:  CIPRO Take 1 tablet (250 mg total) by mouth  2 (two) times daily.   dexamethasone 4 MG tablet Commonly known as:  DECADRON Take 1 tablet (4 mg total) by mouth 2 (two) times daily.   fluconazole 100 MG tablet Commonly known as:  DIFLUCAN Take 1 tablet (100 mg total) by mouth daily.   furosemide 40 MG tablet Commonly known as:  LASIX Take 40 mg by mouth daily.   gabapentin 400 MG capsule Commonly known as:  NEURONTIN Take 800 mg  by mouth at bedtime.   gefitinib 250 MG tablet Commonly known as:  IRESSA Take 1 tablet (250 mg total) by mouth daily.   insulin glargine 100 unit/mL Sopn Commonly known as:  LANTUS Inject 15 Units into the skin at bedtime.   mirtazapine 15 MG tablet Commonly known as:  REMERON Take 1 tablet (15 mg total) by mouth at bedtime.   nystatin powder Commonly known as:  MYCOSTATIN/NYSTOP Apply topically 3 (three) times daily.   oxyCODONE 5 MG immediate release tablet Commonly known as:  Oxy IR/ROXICODONE Take 1 tablet (5 mg total) by mouth every 4 (four) hours as needed for severe pain.      Follow-up Information    Molli Hazard, MD Follow up on 07/28/2016.   Specialties:  Hematology and Oncology, Oncology Why:  appointment time 9:30AM. Patient will see the PA. Contact information: [place] [address] [phone]          No Known Allergies  Consultations:     Procedures/Studies: Ct Head Wo Contrast  Result Date: 07/14/2016 CLINICAL DATA:  Altered mental status. Lung cancer with metastatic disease to the brain. EXAM: CT HEAD WITHOUT CONTRAST TECHNIQUE: Contiguous axial images were obtained from the base of the skull through the vertex without intravenous contrast. COMPARISON:  MRI brain 07/06/2016 and 04/30/2016. FINDINGS: Brain: Innumerable calcified metastatic lesions are again noted. There is no acute hemorrhage or significant mass effect. Ventricles are normal size. No significant extra-axial fluid collection is present. No acute infarct is present. Mild atrophy and diffuse white matter disease is noted bilaterally. The basal ganglia are intact. Vascular: Cavernous internal carotid artery calcifications are asymmetric on the right. There is no hyperdense vessel. Skull: Known lytic lesions are again seen within the calvarium. No new lesions are present. Sinuses/Orbits: The paranasal sinuses and mastoid air cells are clear. Bilateral lens  replacements are noted. The globes and orbits are intact. IMPRESSION: 1. Innumerable punctate calcified lesions throughout the brain compatible metastatic disease. This may represent calcification following treatment. 2. No significant mass effect or large new lesions. 3. No acute intracranial abnormality. Electronically Signed   By: San Morelle M.D.   On: 07/14/2016 12:17   Ct Chest W Contrast  Result Date: 07/07/2016 CLINICAL DATA:  Adenocarcinoma of the left lung diagnosed 9/17. Radiation therapy. Restaging. Brain metastasis. EXAM: CT CHEST WITH CONTRAST TECHNIQUE: Multidetector CT imaging of the chest was performed during intravenous contrast administration. CONTRAST:  35m ISOVUE-300 IOPAMIDOL (ISOVUE-300) INJECTION 61% COMPARISON:  Chest radiograph 05/13/2016.  CT of 04/20/2016 FINDINGS: Cardiovascular: Aortic and branch vessel atherosclerosis. Tortuous thoracic aorta. Mild cardiomegaly, without pericardial effusion. Multivessel coronary artery atherosclerosis. No central pulmonary embolism, on this non-dedicated study. Mediastinum/Nodes: No supraclavicular adenopathy. No mediastinal or hilar adenopathy. Lungs/Pleura: No pleural fluid. Subpleural right lower lobe vague 5 mm nodule on image 83/ series 3 is similar. More cephalad right lower lobe 6 mm subpleural nodule on image 70/series 3 is also not significantly changed. Minimal left upper lobe nodularity on image 59/series 3 is unchanged. Superior segment  left lower lobe lung mass measures 3.5 x 3.0 cm on image 78/series 3. Compare 5.0 x 4.2 cm on the prior. Minimal more lateral superior segment left lower lobe nodularity is likely related to the same process and at the site of the dominant mass on the prior. Upper Abdomen: Possible irregular hepatic capsule, including on image 169/series 2. Normal imaged portions of the spleen, stomach, pancreas, adrenal glands, left kidney. Aortic and branch vessel atherosclerosis. Musculoskeletal:  Development of multi focal sclerotic osseous metastasis. Example in the right humeral head at 1.8 cm. Compression deformity at T4 persists. There is a superior endplate compression deformity at L1 which is new. IMPRESSION: 1. Since the CT of 04/20/2016, decreased size of a superior segment left lower lobe lung mass. 2. No thoracic adenopathy. 3. Other smaller pulmonary nodules are nonspecific and unchanged. 4. Widespread osseous metastasis, new since 04/20/2016 CT but described and detailed on the MRI of 04/26/2016. 5.  Coronary artery atherosclerosis. Aortic atherosclerosis. 6. Possible mild cirrhosis.  Correlate with risk factors. Electronically Signed   By: Abigail Miyamoto M.D.   On: 07/07/2016 09:44   Mr Jeri Cos AC Contrast  Result Date: 07/06/2016 CLINICAL DATA:  81 year old female with confusion and memory loss for 3 weeks. Left lung adenocarcinoma with extensive metastatic disease to the brain demonstrated in September. Per the medical record the patient may have had some radiation treatment since brain metastases were diagnosed, but not the full course. Patient opted for Hospice. Subsequent encounter. EXAM: MRI HEAD WITHOUT AND WITH CONTRAST TECHNIQUE: Multiplanar, multiecho pulse sequences of the brain and surrounding structures were obtained without and with intravenous contrast. CONTRAST:  24m MULTIHANCE GADOBENATE DIMEGLUMINE 529 MG/ML IV SOLN COMPARISON:  Brain MRI 04/30/2016. FINDINGS: Brain: Innumerable sub-centimeter enhancing brain metastases re- demonstrated. In general all of the largest brain metastases appear slightly smaller than on 04/26/2016. No areas of significant edema. No intracranial mass effect. Osseous metastatic disease involving the calvarium with mild associated dural thickening (e.g. Right parietal convexity series 12, image 57). The skull osseous metastases also have not significantly progressed. There is no leptomeningeal metastasis identified. No restricted diffusion to  suggest acute infarction. No midline shift, mass effect, ventriculomegaly, extra-axial fluid collection or acute intracranial hemorrhage. Cervicomedullary junction and pituitary are within normal limits. Vascular: Major intracranial vascular flow voids are stable and within normal limits. Skull and upper cervical spine: Grossly negative visualized cervical spinal cord. Occipital condyles and visible upper cervical spine bone marrow signal remains normal. Sinuses/Orbits: Stable and negative. Other: Visible internal auditory structures appear normal. Negative scalp soft tissues. IMPRESSION: 1. Innumerable subcentimeter brain metastases without progression since 04/30/2016. Many of the dominant lesions appear slightly smaller now suggesting some response to treatment. No significant cerebral edema. No intracranial mass effect. 2. Several skull metastases also not significantly progressed since September. The largest of these in the right parietal bone appears to have some underlying dural involvement. 3. No new intracranial abnormality. Electronically Signed   By: HGenevie AnnM.D.   On: 07/06/2016 20:18   Dg Chest Port 1 View  Result Date: 07/14/2016 CLINICAL DATA:  Altered mental status. EXAM: PORTABLE CHEST 1 VIEW COMPARISON:  05/13/2016 FINDINGS: Heart is normal size. No confluent airspace opacities or effusions. No acute bony abnormality. IMPRESSION: No active disease. Electronically Signed   By: KRolm BaptiseM.D.   On: 07/14/2016 13:50       Subjective: Patient denies any pain, she is awake and alert  Discharge Exam: Vitals:   07/16/16 0000 07/16/16  0155  BP: 138/80 (!) 159/78  Pulse: 74 75  Resp:  18  Temp:  98.1 F (36.7 C)   Vitals:   07/16/16 0000 07/16/16 0155  BP: 138/80 (!) 159/78  Pulse: 74 75  Resp:  18  Temp:  98.1 F (36.7 C)  TempSrc:  Oral  SpO2: 93% 97%  Weight:  98.4 kg (216 lb 14.9 oz)    General: Pt is alert, awake, not in acute distress Cardiovascular: RRR, S1/S2 +,  no rubs, no gallops Respiratory: CTA bilaterally, no wheezing, no rhonchi Abdominal: Soft, NT, ND, bowel sounds + Extremities: no edema, no cyanosis    The results of significant diagnostics from this hospitalization (including imaging, microbiology, ancillary and laboratory) are listed below for reference.     Microbiology: Recent Results (from the past 240 hour(s))  Urine culture     Status: Abnormal   Collection Time: 07/14/16  1:41 PM  Result Value Ref Range Status   Specimen Description URINE, CLEAN CATCH  Final   Special Requests NONE  Final   Culture MULTIPLE SPECIES PRESENT, SUGGEST RECOLLECTION (A)  Final   Report Status 07/16/2016 FINAL  Final     Labs: BNP (last 3 results) No results for input(s): BNP in the last 8760 hours. Basic Metabolic Panel:  Recent Labs Lab 07/14/16 1027 07/15/16 0532 07/15/16 2319  NA 135 134* 134*  K 4.5 3.8 3.2*  CL 94* 95* 94*  CO2 '26 28 27  '$ GLUCOSE 391* 332* 286*  BUN 39* 31* 32*  CREATININE 0.83 0.71 0.94  CALCIUM 8.9 8.1* 8.3*   Liver Function Tests:  Recent Labs Lab 07/14/16 1027 07/15/16 2319  AST 39 33  ALT 59* 49  ALKPHOS 116 109  BILITOT 1.3* 0.5  PROT 5.8* 5.4*  ALBUMIN 3.0* 2.7*   No results for input(s): LIPASE, AMYLASE in the last 168 hours. No results for input(s): AMMONIA in the last 168 hours. CBC:  Recent Labs Lab 07/14/16 1027 07/15/16 0532 07/15/16 2319  WBC 14.7* 9.4 9.1  NEUTROABS 13.0*  --  7.8*  HGB 13.0 10.7* 11.9*  HCT 38.1 32.2* 35.2*  MCV 86.6 87.7 86.9  PLT 150 123* 124*   Cardiac Enzymes: No results for input(s): CKTOTAL, CKMB, CKMBINDEX, TROPONINI in the last 168 hours. BNP: Invalid input(s): POCBNP CBG:  Recent Labs Lab 07/14/16 2109 07/15/16 0753 07/15/16 1113 07/16/16 0750 07/16/16 1159  GLUCAP 310* 314* 181* 308* 128*   D-Dimer No results for input(s): DDIMER in the last 72 hours. Hgb A1c  Recent Labs  07/15/16 0532  HGBA1C 8.4*   Lipid Profile No  results for input(s): CHOL, HDL, LDLCALC, TRIG, CHOLHDL, LDLDIRECT in the last 72 hours. Thyroid function studies No results for input(s): TSH, T4TOTAL, T3FREE, THYROIDAB in the last 72 hours.  Invalid input(s): FREET3 Anemia work up No results for input(s): VITAMINB12, FOLATE, FERRITIN, TIBC, IRON, RETICCTPCT in the last 72 hours. Urinalysis    Component Value Date/Time   COLORURINE YELLOW 07/15/2016 [place] 07/15/2016 2339   LABSPEC 1.025 07/15/2016 2339   PHURINE 5.0 07/15/2016 2339   GLUCOSEU NEGATIVE 07/15/2016 2339   HGBUR LARGE (A) 07/15/2016 2339   BILIRUBINUR NEGATIVE 07/15/2016 2339   KETONESUR NEGATIVE 07/15/2016 2339   PROTEINUR NEGATIVE 07/15/2016 2339   NITRITE NEGATIVE 07/15/2016 2339   LEUKOCYTESUR NEGATIVE 07/15/2016 2339   Sepsis Labs Invalid input(s): PROCALCITONIN,  WBC,  LACTICIDVEN Microbiology Recent Results (from the past 240 hour(s))  Urine culture     Status:  Abnormal   Collection Time: 07/14/16  1:41 PM  Result Value Ref Range Status   Specimen Description URINE, CLEAN CATCH  Final   Special Requests NONE  Final   Culture MULTIPLE SPECIES PRESENT, SUGGEST RECOLLECTION (A)  Final   Report Status 07/16/2016 FINAL  Final     Time coordinating discharge: Over 30 minutes  SIGNED:   Kathie Dike, MD  [facility] 07/16/2016, 4:19 PM Pager   If 7PM-7AM, please contact night-coverage [email or web address] Password [email or web address]

## 2016-07-16 NOTE — Evaluation (Signed)
Physical Therapy Evaluation Patient Details Name: Christina Estes MRN: [id number] DOB: [birth year] Today's Date: 07/16/2016   History of Present Illness  81 y.o. female with medical history significant of lung cancer with mets to brain, recent uti, HTN, just d/c earlier today to home with son by dr Roderic Palau after being treated for AMS and uti comes back in for generalized weakness, and fall today at home.  Son is interested in SNF placement as he does not think he can take care of her at home, this is report from dr knapp in ED.  Pt referred for admission for placement.  Clinical Impression  Pt received in bed, and is agreeable to PT evaluation.  Pt expressed that she was able to ambulate with a RW prior to admission, and she was able to perform her own sponge bath, and get herself dressed.  She did require the use of a stair lift to get in/out of the house.  During today's PT evaluation, she required Mod A for supine<>sit, and Max A for sit<>stand due to weakness in LE's.  Once she was standing, she only required min A for gait x 18f, however she required constant cues for safety.  Although she answered orientation questions correctly initially, she later asked me what day it was, and had difficulty following commands at times.  She is currently recommended for SNF due to increased need for assistance for basic functional mobility tasks, and husband will not be able to assist due to a back injury for which he is being evaluated by a neurosurgeon.      Follow Up Recommendations SNF    Equipment Recommendations  None recommended by PT    Recommendations for Other Services OT consult     Precautions / Restrictions Precautions Precautions: Fall Precaution Comments: 2 falls, and one was last night.        Mobility  Bed Mobility Overal bed mobility: Needs Assistance Bed Mobility: Supine to Sit     Supine to sit: Mod assist;HOB elevated     General bed mobility comments: Constant cues for  which direction to move her LE's.  Assist to move the LE's, and trunk.  Assist to scoot hips to the EOB.    Transfers Overall transfer level: Needs assistance Equipment used: Rolling walker (2 wheeled) Transfers: Sit to/from SOmnicareSit to Stand: Max assist (tc's to push up with hands.  ) Stand pivot transfers: Min assist (vc's to get lined up with the chair and to reach back for the chair. )       General transfer comment: Pt ambulated into the bathroom and was able to voide.  She required supervision for peri-hygiene due to attempting to wipe with her bare hand instead of toilet paper.   Ambulation/Gait Ambulation/Gait assistance: Min assist Ambulation Distance (Feet): 10 Feet Assistive device: Rolling walker (2 wheeled) Gait Pattern/deviations: Step-to pattern;Wide base of support;Trunk flexed     General Gait Details: VC's for pt to keep the RW closer to her body    Stairs            Wheelchair Mobility    Modified Rankin (Stroke Patients Only)       Balance Overall balance assessment: History of Falls;Needs assistance Sitting-balance support: Bilateral upper extremity supported;Feet supported Sitting balance-Leahy Scale: Fair     Standing balance support: Bilateral upper extremity supported Standing balance-Leahy Scale: Zero  Pertinent Vitals/Pain Pain Assessment: No/denies pain    Home Living   Living Arrangements: Spouse/significant other (husband has a bad back. ) Available Help at Discharge:  (Had set up to have home health start coming out to see her, but they haven't come out yet. )   Home Access: Stairs to enter;Other (comment) (Chair lift. )   Entrance Stairs-Number of Steps: 5 with HR on the left.  Home Layout: One level Home Equipment: Walker - 2 wheels;Toilet riser;Hospital bed      Prior Function Level of Independence: Needs assistance   Gait / Transfers Assistance Needed: Pt  was using a RW for ambulation at all times.  Household distances only.   ADL's / Homemaking Assistance Needed: independent with dressing and bathing per patient.          Hand Dominance   Dominant Hand: Right    Extremity/Trunk Assessment   Upper Extremity Assessment: Generalized weakness (B shoulders with limited flexion to ~90*)           Lower Extremity Assessment: Generalized weakness;RLE deficits/detail;LLE deficits/detail RLE Deficits / Details: hip flexors 2/5 LLE Deficits / Details: hip flexors 2/5     Communication   Communication: No difficulties  Cognition Arousal/Alertness: Awake/alert Behavior During Therapy: WFL for tasks assessed/performed Overall Cognitive Status: History of cognitive impairments - at baseline Area of Impairment: Safety/judgement;Following commands       Following Commands: Follows one step commands inconsistently Safety/Judgement: Decreased awareness of safety;Decreased awareness of deficits     General Comments: Pt has lung CA with mets to the brain.  Pt had difficulty following some commands and would go the opposite direction the PT requested of her.  When pt was asked to perform her own peri-hygiene, she began to wipe herself with just her bare hand instead of getting toilet paper first.  PT corrected by handing her toilet paper.     General Comments      Exercises     Assessment/Plan    PT Assessment Patient needs continued PT services  PT Problem List Decreased strength;Decreased activity tolerance;Decreased balance;Decreased mobility;Decreased cognition;Decreased coordination;Decreased range of motion;Decreased safety awareness;Decreased knowledge of precautions;Decreased knowledge of use of DME;Obesity          PT Treatment Interventions DME instruction;Gait training;Functional mobility training;Therapeutic activities;Therapeutic exercise;Balance training;Patient/family education    PT Goals (Current goals can be found  in the Care Plan section)  Acute Rehab PT Goals Patient Stated Goal: To get stronger PT Goal Formulation: With patient/family Time For Goal Achievement: 07/23/16 Potential to Achieve Goals: Fair    Frequency Min 5X/week   Barriers to discharge Decreased caregiver support Pt lives with her husband, however, he has injured his back, and is going to have it evaluated by a neurosurgeon today.    Co-evaluation               End of Session Equipment Utilized During Treatment: Gait belt Activity Tolerance: Patient tolerated treatment well Patient left: in chair;with call bell/phone within reach;with family/visitor present Nurse Communication: Mobility status (Val, RN notified of pt's location and mobility level.  STEDY is recommended to transfer pt.  Tech notified of no BSC in the room, and she stated she was going to look for one. )    Functional Assessment Tool Used: [place] "6-clicks"  Functional Limitation: Mobility: Walking and moving around Mobility: Walking and Moving Around Current Status [phone]): At least 40 percent but less than 60 percent impaired, limited or restricted Mobility: Walking and Moving  Around Goal Status [phone]): At least 20 percent but less than 40 percent impaired, limited or restricted    Time: 1119-1200 PT Time Calculation (min) (ACUTE ONLY): 41 min   Charges:   PT Evaluation $PT Eval Moderate Complexity: 1 Procedure PT Treatments $Gait Training: 8-22 mins $Therapeutic Activity: 8-22 mins   PT G Codes:   PT G-Codes **NOT FOR INPATIENT CLASS** Functional Assessment Tool Used: [employer] "6-clicks"  Functional Limitation: Mobility: Walking and moving around Mobility: Walking and Moving Around Current Status [phone]): At least 40 percent but less than 60 percent impaired, limited or restricted Mobility: Walking and Moving Around Goal Status [phone]): At least 20 percent but less than 40 percent impaired, limited or  restricted    Beth Jacion [place], PT, DPT X: [phone]

## 2016-07-16 NOTE — Progress Notes (Signed)
Inpatient Diabetes Program Recommendations  AACE/ADA: New Consensus Statement on Inpatient Glycemic Control (2015)  Target Ranges:  Prepandial:   less than 140 mg/dL      Peak postprandial:   less than 180 mg/dL (1-2 hours)      Critically ill patients:  140 - 180 mg/dL   Lab Results  Component Value Date   GLUCAP 308 (H) 07/16/2016   HGBA1C 8.4 (H) 07/15/2016    Review of Glycemic Control Results for Christina Estes, Christina Estes (MRN [id number]) as of 07/16/2016 11:45  Ref. Range 07/14/2016 21:09 07/15/2016 07:53 07/15/2016 11:13 07/16/2016 07:50  Glucose-Capillary Latest Ref Range: 65 - 99 mg/dL 310 (H) 314 (H) 181 (H) 308 (H)   Diabetes history: DM2 Outpatient Diabetes medications: Amaryl 2 mg bid  Current orders for Inpatient glycemic control: Novolog 0-9 units tid  Inpatient Diabetes Program Recommendations:    Please consider restarting Lantus 10 units qd.  Thank you, Nani Gasser. Krystiana Fornes, RN, MSN, CDE Inpatient Glycemic Control Team Team Pager [phone] (8am-5pm) 07/16/2016 11:50 AM

## 2016-07-16 NOTE — Clinical Social Work Note (Signed)
Family now states they want [facility]. Facility reviewing. Still awaiting authorization.  Benay Pike, [place]

## 2016-07-16 NOTE — NC FL2 (Signed)
[place] LEVEL OF CARE SCREENING TOOL     IDENTIFICATION  Patient Name: JAEMARIE HOCHBERG Birthdate: 03-01-1935 Sex: female Admission Date (Current Location): 07/15/2016  [facility] and [place] Number:  [employer] and Address:  [place] [address] [place]      Provider Number: [id number]  Attending Physician Name and Address:  Kathie Dike, MD  Relative Name and Phone Number:       Current Level of Care: Hospital Recommended Level of Care: [place] Prior Approval Number:    Date Approved/Denied:   PASRR Number:    Discharge Plan: Home    Current Diagnoses: Patient Active Problem List   Diagnosis Date Noted  . Weakness 07/16/2016  . Encounter for nursing home hospice care 07/16/2016  . Confusion 07/14/2016  . Brain metastases ([place]) 07/14/2016  . EGFR-related lung cancer ([place]) 05/29/2016  . Melanoma of skin ([place]) 05/15/2016  . UTI (urinary tract infection) 05/15/2016  . Protein-calorie malnutrition, severe 05/14/2016  . Pressure ulcer 05/14/2016  . Dehydration   . Primary lung cancer with metastasis from lung to other site [facility])   . Left leg weakness   . Hypovolemic shock ([place]) 05/13/2016  . Counseling regarding end of life decision making 05/12/2016  . Decubitus skin ulcer 05/12/2016  . Weight loss 05/12/2016  . Bone metastases ([place]) 05/11/2016  . Adenocarcinoma of left lung ([place]) 04/26/2016  . Acute urinary retention 04/26/2016  . AKI (acute kidney injury) ([place]) 04/26/2016  . Essential hypertension 04/22/2016  . Diabetes ([place]) 04/22/2016  . Adjustment disorder with mixed anxiety and depressed mood 04/22/2016  . Stomach ulcer 04/22/2016  . Gall bladder disease 04/22/2016  . History of back surgery 04/22/2016  . GERD (gastroesophageal reflux disease) 04/22/2016  . Lung mass 04/22/2016    Orientation RESPIRATION BLADDER Height & Weight     Self, Time, Place  Normal Incontinent,  Continent Weight: 216 lb 14.9 oz (98.4 kg) Height:     BEHAVIORAL SYMPTOMS/MOOD NEUROLOGICAL BOWEL NUTRITION STATUS      Continent Diet (Heart Healthy/carb modified)  AMBULATORY STATUS COMMUNICATION OF NEEDS Skin   Extensive Assist Verbally Normal                       Personal Care Assistance Level of Assistance  Bathing, Feeding, Dressing Bathing Assistance: Maximum assistance Feeding assistance: Independent Dressing Assistance: Maximum assistance     Functional Limitations Info  Sight, Hearing, Speech Sight Info: Adequate Hearing Info: Adequate Speech Info: Adequate    SPECIAL CARE FACTORS FREQUENCY  PT (By licensed PT)     PT Frequency: 5x/week              Contractures Contractures Info: Not present    Additional Factors Info  Code Status, Psychotropic Code Status Info: DNR   Psychotropic Info: Xanax, Neurontin, Remeron         Current Medications (07/16/2016):  This is the current hospital active medication list Current Facility-Administered Medications  Medication Dose Route Frequency Provider Last Rate Last Dose  . 0.9 %  sodium chloride infusion  250 mL Intravenous PRN Phillips Grout, MD      . ciprofloxacin (CIPRO) tablet 250 mg  250 mg Oral BID Phillips Grout, MD   250 mg at 07/16/16 0900  . dexamethasone (DECADRON) tablet 4 mg  4 mg Oral BID Phillips Grout, MD   4 mg at 07/16/16 1105  . furosemide (LASIX) tablet 40  mg  40 mg Oral Daily Phillips Grout, MD   40 mg at 07/16/16 0900  . insulin aspart (novoLOG) injection 0-9 Units  0-9 Units Subcutaneous TID WC Phillips Grout, MD   7 Units at 07/16/16 [phone]  . sodium chloride flush (NS) 0.9 % injection 3 mL  3 mL Intravenous Q12H Phillips Grout, MD   3 mL at 07/16/16 1105  . sodium chloride flush (NS) 0.9 % injection 3 mL  3 mL Intravenous PRN Phillips Grout, MD         Discharge Medications: Please see discharge summary for a list of discharge medications.  Relevant Imaging Results:  Relevant  Lab Results:   Additional Information [phone]  Ihor Gully, LCSW

## 2016-07-16 NOTE — Patient Outreach (Signed)
RN CM called pt for transition of care week 1, pt discharged from hospital 07/15/16- diagnosis cancer, UTI, confusion.  Patient's husband answered the telephone and states pt back in hospital for falling "just about as soon as she got home"  Pt states plans may be SNF as it is getting more difficult to care for pt at home.  RN CM notified [name] RN hospital liason of admission.  Jacqlyn Larsen [facility], [place] Coordinator [phone]

## 2016-07-16 NOTE — Care Management Obs Status (Signed)
[place] NOTIFICATION   Patient Details  Name: AVIGAYIL TON MRN: [id number] Date of Birth: 1935-01-22   Medicare Observation Status Notification Given:  Yes    Dorothee Napierkowski, Chauncey [place], RN 07/16/2016, 2:26 PM

## 2016-07-16 NOTE — Telephone Encounter (Signed)
Went down to see Christina Estes in rm 325. She was talking on her remote like it was a phone to her son Christina Estes.  I asked where Christina Estes was and said said sitting over there in Christina corner.  She was very confused.  Called Christina Estes to talk with him. He said that he was going to let Christina Estes make Christina decision.  He has hurt his back and is in a lot of pain.  Can not help Christina Estes much right now.  Called Christina Estes and talked with Christina Estes.  He wanted to wait to see what Christina Estes said was Christina best idea.  I said ultimately it was their decision.  She can go to [facility] nursing home and continue taking Christina Christina Estes or she can go to [facility] nursing home and have hospice.  Christina Estes is a lot and really needs to have Christina Estes.  Every time I ask Christina Estes if she wants Estes she says "no more Estes."  Christina son said that he would get her to a nursing home and wait until Christina appt with Christina Estes Friday to make anymore decisions.

## 2016-07-16 NOTE — H&P (Signed)
History and Physical    Christina Estes LNL:[id number] DOB: 17-Jun-1935 DOA: 07/15/2016  PCP: [name], MD  Patient coming from: home  Chief Complaint:   weak  HPI: Christina Estes is a 81 y.o. female with medical history significant of lung cancer with mets to brain, recent uti, HTN, just d/c earlier today to home with son by dr Roderic Palau after being treated for AMS and uti comes back in for generalized weakness, and fall today at home.  Son is interested in SNF placement as he does not think he can take care of her at home, this is report from dr knapp in ED.  Pt referred for admission for placement.   Review of Systems: unobtainable due to ams  Past Medical History:  Diagnosis Date  . Anxiety   . Arthritis    "qwhere" (05/14/2016)  . Basal cell carcinoma    "arms, hands, face; some cut off/some burned off"  . Cancer ([place])    "I'm eat up w/cancer" (05/14/2016)  . Chronic back pain    "all over my back" (05/14/2016)  . Coronary artery disease   . Depression   . DOE (dyspnea on exertion)   . GERD (gastroesophageal reflux disease)   . History of blood transfusion    "when I was pregnant"  . History of hiatal hernia   . Hypercholesteremia   . Hypertension   . Lung cancer [facility])    pt son reports lung cancer then diagnosed with brain and bone metastisis.  . Lung mass 04/22/2016  . Malignant melanoma of arm ([place])    "left"  . Obesity   . PONV (postoperative nausea and vomiting)   . Type II diabetes mellitus ([place])     Past Surgical History:  Procedure Laterality Date  . BACK SURGERY     "mid back; don't know what they did" (05/14/2016)  . BREAST BIOPSY Right   . BREAST LUMPECTOMY Right    "not cancer"  . CARDIAC CATHETERIZATION  12/11/2003  . CARPAL TUNNEL RELEASE Bilateral   . CATARACT EXTRACTION W/ INTRAOCULAR LENS  IMPLANT, BILATERAL    . CHOLECYSTECTOMY OPEN    . DILATION AND CURETTAGE OF UTERUS    . MELANOMA EXCISION Left    "upper arm"  . TONSILLECTOMY  ~  1944  . VAGINAL HYSTERECTOMY    . VIDEO BRONCHOSCOPY N/A 04/30/2016   Procedure: VIDEO BRONCHOSCOPY;  Surgeon: [name], MD;  Location: [facility];  Service: Thoracic;  Laterality: N/A;     reports that she has never smoked. She has never used smokeless tobacco. She reports that she does not drink alcohol or use drugs.  Allergies  Allergen Reactions  . No Known Allergies     Family History  Problem Relation Age of Onset  . Cancer Mother   . Diabetes Mellitus II Son   . Cancer Brother     Prior to Admission medications   Medication Sig Start Date End Date Taking? Authorizing Provider  ALPRAZolam Duanne Moron) 1 MG tablet Take 1 tablet (1 mg total) by mouth at bedtime as needed for anxiety. 05/01/16   Norman Herrlich, MD  blood glucose meter kit and supplies KIT Dispense based on patient and insurance preference. Use up to four times daily as directed. (FOR ICD-9 250.00, 250.01). 07/15/16   Kathie Dike, MD  ciprofloxacin (CIPRO) 250 MG tablet Take 1 tablet (250 mg total) by mouth 2 (two) times daily. 07/15/16   Kathie Dike, MD  dexamethasone (DECADRON) 4 MG  tablet Take 1 tablet (4 mg total) by mouth 2 (two) times daily. 07/15/16   Kathie Dike, MD  furosemide (LASIX) 40 MG tablet Take 40 mg by mouth daily.  06/24/16   Historical Provider, MD  gabapentin (NEURONTIN) 400 MG capsule 2 capsules at bedtime. 05/28/16   Historical Provider, MD  gefitinib (IRESSA) 250 MG tablet Take 1 tablet (250 mg total) by mouth daily. 07/12/16   [name], PA-C  Insulin Glargine (LANTUS) 100 UNIT/ML Solostar Pen Inject 15 Units into the skin daily at 10 pm. 07/15/16   Kathie Dike, MD  Insulin Pen Needle 29G X 10MM MISC Use as directed 07/15/16   Kathie Dike, MD  mirtazapine (REMERON) 15 MG tablet Take 1 tablet (15 mg total) by mouth at bedtime. 07/07/16   Patrici Ranks, MD  oxyCODONE (OXY IR/ROXICODONE) 5 MG immediate release tablet Take 1 tablet (5 mg total) by mouth every 4 (four)  hours as needed for severe pain. 05/01/16   Norman Herrlich, MD    Physical Exam: Vitals:   07/16/16 0000  BP: 138/80  Pulse: 74  SpO2: 93%      Constitutional: NAD, calm, comfortable Vitals:   07/16/16 0000  BP: 138/80  Pulse: 74  SpO2: 93%   Eyes: PERRL, lids and conjunctivae normal ENMT: Mucous membranes are moist. Posterior pharynx clear of any exudate or lesions.Normal dentition.  Neck: normal, supple, no masses, no thyromegaly Respiratory: clear to auscultation bilaterally, no wheezing, no crackles. Normal respiratory effort. No accessory muscle use.  Cardiovascular: Regular rate and rhythm, no murmurs / rubs / gallops. No extremity edema. 2+ pedal pulses. No carotid bruits.  Abdomen: no tenderness, no masses palpated. No hepatosplenomegaly. Bowel sounds positive.  Musculoskeletal: no clubbing / cyanosis. No joint deformity upper and lower extremities. Good ROM, no contractures. Normal muscle tone.  Skin: no rashes, lesions, ulcers. No induration Neurologic: CN 2-12 grossly intact. Sensation intact, DTR normal. Strength 5/5 in all 4.  Psychiatric: Normal judgment and insight. Alert and oriented x 3. Normal mood.    Labs on Admission: I have personally reviewed following labs and imaging studies  CBC:  Recent Labs Lab 07/14/16 1027 07/15/16 0532 07/15/16 2319  WBC 14.7* 9.4 9.1  NEUTROABS 13.0*  --  7.8*  HGB 13.0 10.7* 11.9*  HCT 38.1 32.2* 35.2*  MCV 86.6 87.7 86.9  PLT 150 123* 751*   Basic Metabolic Panel:  Recent Labs Lab 07/14/16 1027 07/15/16 0532 07/15/16 2319  NA 135 134* 134*  K 4.5 3.8 3.2*  CL 94* 95* 94*  CO2 _0 GLUCOSE 391* 332* 286*  BUN 39* 31* 32*  CREATININE 0.83 0.71 0.94  CALCIUM 8.9 8.1* 8.3*   GFR: Estimated Creatinine Clearance: 59.7 mL/min (by C-G formula based on SCr of 0.94 mg/dL). Liver Function Tests:  Recent Labs Lab 07/14/16 1027 07/15/16 2319  AST 39 33  ALT 59* 49  ALKPHOS 116 109  BILITOT 1.3* 0.5    PROT 5.8* 5.4*  ALBUMIN 3.0* 2.7*   CBG:  Recent Labs Lab 07/14/16 2109 07/15/16 0753 07/15/16 1113  GLUCAP 310* 314* 181*   Urine analysis:    Component Value Date/Time   COLORURINE YELLOW 07/15/2016 [place] 07/15/2016 2339   LABSPEC 1.025 07/15/2016 2339   PHURINE 5.0 07/15/2016 2339   GLUCOSEU NEGATIVE 07/15/2016 2339   HGBUR LARGE (A) 07/15/2016 2339   BILIRUBINUR NEGATIVE 07/15/2016 2339   KETONESUR NEGATIVE 07/15/2016 2339   PROTEINUR NEGATIVE 07/15/2016  [place] 07/15/2016 [place] 07/15/2016 2339   Radiological Exams on Admission: Ct Head Wo Contrast  Result Date: 07/14/2016 CLINICAL DATA:  Altered mental status. Lung cancer with metastatic disease to the brain. EXAM: CT HEAD WITHOUT CONTRAST TECHNIQUE: Contiguous axial images were obtained from the base of the skull through the vertex without intravenous contrast. COMPARISON:  MRI brain 07/06/2016 and 04/30/2016. FINDINGS: Brain: Innumerable calcified metastatic lesions are again noted. There is no acute hemorrhage or significant mass effect. Ventricles are normal size. No significant extra-axial fluid collection is present. No acute infarct is present. Mild atrophy and diffuse white matter disease is noted bilaterally. The basal ganglia are intact. Vascular: Cavernous internal carotid artery calcifications are asymmetric on the right. There is no hyperdense vessel. Skull: Known lytic lesions are again seen within the calvarium. No new lesions are present. Sinuses/Orbits: The paranasal sinuses and mastoid air cells are clear. Bilateral lens replacements are noted. The globes and orbits are intact. IMPRESSION: 1. Innumerable punctate calcified lesions throughout the brain compatible metastatic disease. This may represent calcification following treatment. 2. No significant mass effect or large new lesions. 3. No acute intracranial abnormality. Electronically Signed   By:  [place] Morelle M.D.   On: 07/14/2016 12:17   Dg Chest Port 1 View  Result Date: 07/14/2016 CLINICAL DATA:  Altered mental status. EXAM: PORTABLE CHEST 1 VIEW COMPARISON:  05/13/2016 FINDINGS: Heart is normal size. No confluent airspace opacities or effusions. No acute bony abnormality. IMPRESSION: No active disease. Electronically Signed   By: Rolm Baptise M.D.   On: 07/14/2016 13:50    Assessment/Plan 81 yo female with stage 4 lung cancer needs SNF /rehab placement  Principal Problem:   Encounter for nursing home hospice care- consult CM  Active Problems:   Primary lung cancer with metastasis from lung to other site [facility])   Brain metastases ([place])   Weakness   DVT prophylaxis: scds  Code Status:   DNR Family Communication: none Disposition Plan:  Per day team Consults called:  none Admission status:   observation   Chandni Gagan A MD [facility]  If 7PM-7AM, please contact night-coverage [email or web address] Password [email or web address]  07/16/2016, 1:09 AM

## 2016-07-16 NOTE — Consult Note (Addendum)
   [facility] CM Inpatient Consult   07/16/2016  LADA FULBRIGHT Jan 20, 1935 [id number]   Patient was just signed up with [facility] on yesterday and was readmitted back to hospital. Chart reviewed. Noted discharge plan is for SNF. Will make referral to [facility] LCSW as well for follow up with patient at SNF. Spoke with inpatient Licensed CSW to confirm tentative discharge plans as she is awaiting SNF authorization.   [name], MSN-Ed, RN,BSN [facility] Liaison [phone]

## 2016-07-16 NOTE — Clinical Social Work Placement (Addendum)
   CLINICAL SOCIAL WORK PLACEMENT  NOTE  Date:  07/16/2016  Patient Details  Name: Christina Estes MRN: [id number] Date of Birth: 08/27/1934  Clinical Social Work is seeking post-discharge placement for this patient at the [place] level of care (*CSW will initial, date and re-position this form in  chart as items are completed):  Yes   Patient/family provided with [place] Work Department's list of facilities offering this level of care within the geographic area requested by the patient (or if unable, by the patient's family).  Yes   Patient/family informed of their freedom to choose among providers that offer the needed level of care, that participate in Medicare, Medicaid or managed care program needed by the patient, have an available bed and are willing to accept the patient.  Yes   Patient/family informed of [facility]'s ownership interest in [facility] and [facility], as well as of the fact that they are under no obligation to receive care at these facilities.  PASRR submitted to EDS on 07/16/16     PASRR number received on 07/16/16     Existing PASRR number confirmed on       FL2 transmitted to all facilities in geographic area requested by pt/family on 07/16/16     FL2 transmitted to all facilities within larger geographic area on       Patient informed that his/her managed care company has contracts with or will negotiate with certain facilities, including the following:        Yes   Patient/family informed of bed offers received.  Patient chooses bed at [facility]     Physician recommends and patient chooses bed at      Patient to be transferred to [facility] on 07/16/16.  Patient to be transferred to facility by son     Patient family notified on 07/16/16 of transfer.  Name of family member notified:  Nicki Reaper- son     PHYSICIAN       Additional Comment:  Authorization: [id number]. Facility aware of 30  day pasarr.   _______________________________________________ Salome Arnt, [place] 07/16/2016, 4:52 PM [phone]

## 2016-07-16 NOTE — Progress Notes (Signed)
Discharged to [facility] called and given to [employer] LPN. Accompanied by staff to an awaiting vehicle.

## 2016-07-17 LAB — URINE CULTURE: Culture: NO GROWTH

## 2016-07-19 ENCOUNTER — Encounter: Payer: Self-pay | Admitting: Licensed Clinical Social Worker

## 2016-07-19 ENCOUNTER — Other Ambulatory Visit: Payer: Self-pay | Admitting: Licensed Clinical Social Worker

## 2016-07-19 NOTE — Patient Outreach (Addendum)
[facility] Care Management follow up. Chart reviewed. Noted Christina Estes discharged to [name] on 07/16/16. Sent notification to [facility] CSW of dc facility.  [name], MSN-Ed, RN,BSN [facility] Liaison [phone]

## 2016-07-19 NOTE — Patient Outreach (Signed)
[facility]) Care Management  [facility] Social Work  07/19/2016  Christina Estes 1934/12/24 [id number]  Subjective:    Objective:   Encounter Medications:  Outpatient Encounter Prescriptions as of 07/19/2016  Medication Sig  . ALPRAZolam (XANAX) 1 MG tablet Take 1 tablet (1 mg total) by mouth at bedtime as needed for anxiety.  . ciprofloxacin (CIPRO) 250 MG tablet Take 1 tablet (250 mg total) by mouth 2 (two) times daily.  Marland Kitchen dexamethasone (DECADRON) 4 MG tablet Take 1 tablet (4 mg total) by mouth 2 (two) times daily.  . fluconazole (DIFLUCAN) 100 MG tablet Take 1 tablet (100 mg total) by mouth daily.  . furosemide (LASIX) 40 MG tablet Take 40 mg by mouth daily.   Marland Kitchen gabapentin (NEURONTIN) 400 MG capsule Take 800 mg by mouth at bedtime.   Marland Kitchen gefitinib (IRESSA) 250 MG tablet Take 1 tablet (250 mg total) by mouth daily.  . insulin glargine (LANTUS) 100 unit/mL SOPN Inject 15 Units into the skin at bedtime.  . mirtazapine (REMERON) 15 MG tablet Take 1 tablet (15 mg total) by mouth at bedtime.  Marland Kitchen nystatin (MYCOSTATIN/NYSTOP) powder Apply topically 3 (three) times daily.  Marland Kitchen oxyCODONE (OXY IR/ROXICODONE) 5 MG immediate release tablet Take 1 tablet (5 mg total) by mouth every 4 (four) hours as needed for severe pain.   No facility-administered encounter medications on file as of 07/19/2016.     Functional Status:  In your present state of health, do you have any difficulty performing the following activities: 07/19/2016 07/16/2016  Hearing? N N  Vision? N N  Difficulty concentrating or making decisions? N Y  Walking or climbing stairs? Y Y  Dressing or bathing? Y Y  Doing errands, shopping? Y N  Preparing Food and eating ? Y -  Using the Toilet? Y -  In the past six months, have you accidently leaked urine? N -  Do you have problems with loss of bowel control? N -  Managing your Medications? Y -  Managing your Finances? Y -  Housekeeping or managing your Housekeeping? Y -     Fall/Depression Screening:  PHQ 2/9 Scores 07/19/2016  PHQ - 2 Score 2  PHQ- 9 Score 5    Assessment:    CSW traveled to [facility] in [place], [place] on 07/19/16 and visited with client at facility in room of client.  Patient assessed in  [place] for continued care needs. CSW will continue to collaborate with the skilled nursing facility social worker to facilitate discharge planning needs and communicate with the patient and family. CSW received verbal permission from client on 07/19/16 for CSW to speak with client about current client needs. Client said she is participating in physical therapy sessions for client  as scheduled. She said she is using walker to help her in walking.  She did not mention any pain issues. CSW and client completed [facility] assessments for client. CSW spoke with client about [facility] consent form completion. CSW provided client with [facility] consent form for client to review.  Client and CSW completed [facility] consent form for client on 07/19/16. CSW gave client copy of completed client [facility] consent form. She said she is sleeping well.  She said she has support from her spouse.  She said she has 6 adult children. She said one of her daughters lives nearby. CSW and client spoke of client care plan. CSW encouraged client to particiapte in scheduled client physical therapy sessins for client in  next 30 days at facility.  Client said she hopes to remain at facilty for short term care, to receive nursing support and physcial therapy support. She hopes t be able to return to her home as soon as she is able.  CSW gave client [facility] CSW card. CSW encouraged client to call CSW at [phone] as needed to discuss social work needs of client.    Plan:  Client will participate in scheduled client physical therpay sessions for client  in next 30 days at facility.  CSW to call client in 3 weeks to assess client needs.  Norva Riffle.Ginni Eichler MSW, LCSW Licensed  Clinical Social Worker [facility] Care Management [phone]

## 2016-07-23 ENCOUNTER — Ambulatory Visit (HOSPITAL_COMMUNITY): Payer: Commercial Managed Care - HMO | Admitting: Oncology

## 2016-07-23 ENCOUNTER — Encounter (HOSPITAL_COMMUNITY): Payer: Self-pay | Admitting: Lab

## 2016-07-23 ENCOUNTER — Encounter (HOSPITAL_COMMUNITY): Payer: Commercial Managed Care - HMO | Attending: Oncology | Admitting: Oncology

## 2016-07-23 ENCOUNTER — Encounter (HOSPITAL_COMMUNITY): Payer: Self-pay | Admitting: Oncology

## 2016-07-23 DIAGNOSIS — Z9889 Other specified postprocedural states: Secondary | ICD-10-CM | POA: Insufficient documentation

## 2016-07-23 DIAGNOSIS — R339 Retention of urine, unspecified: Secondary | ICD-10-CM | POA: Insufficient documentation

## 2016-07-23 DIAGNOSIS — C7931 Secondary malignant neoplasm of brain: Secondary | ICD-10-CM | POA: Diagnosis not present

## 2016-07-23 DIAGNOSIS — F419 Anxiety disorder, unspecified: Secondary | ICD-10-CM | POA: Insufficient documentation

## 2016-07-23 DIAGNOSIS — N179 Acute kidney failure, unspecified: Secondary | ICD-10-CM | POA: Insufficient documentation

## 2016-07-23 DIAGNOSIS — E669 Obesity, unspecified: Secondary | ICD-10-CM | POA: Insufficient documentation

## 2016-07-23 DIAGNOSIS — Z79899 Other long term (current) drug therapy: Secondary | ICD-10-CM | POA: Insufficient documentation

## 2016-07-23 DIAGNOSIS — E119 Type 2 diabetes mellitus without complications: Secondary | ICD-10-CM | POA: Insufficient documentation

## 2016-07-23 DIAGNOSIS — Z7984 Long term (current) use of oral hypoglycemic drugs: Secondary | ICD-10-CM | POA: Insufficient documentation

## 2016-07-23 DIAGNOSIS — C4362 Malignant melanoma of left upper limb, including shoulder: Secondary | ICD-10-CM | POA: Insufficient documentation

## 2016-07-23 DIAGNOSIS — I2510 Atherosclerotic heart disease of native coronary artery without angina pectoris: Secondary | ICD-10-CM | POA: Insufficient documentation

## 2016-07-23 DIAGNOSIS — R1310 Dysphagia, unspecified: Secondary | ICD-10-CM | POA: Insufficient documentation

## 2016-07-23 DIAGNOSIS — Z85828 Personal history of other malignant neoplasm of skin: Secondary | ICD-10-CM | POA: Insufficient documentation

## 2016-07-23 DIAGNOSIS — F329 Major depressive disorder, single episode, unspecified: Secondary | ICD-10-CM | POA: Insufficient documentation

## 2016-07-23 DIAGNOSIS — C7951 Secondary malignant neoplasm of bone: Secondary | ICD-10-CM | POA: Insufficient documentation

## 2016-07-23 DIAGNOSIS — M545 Low back pain: Secondary | ICD-10-CM | POA: Insufficient documentation

## 2016-07-23 DIAGNOSIS — M546 Pain in thoracic spine: Secondary | ICD-10-CM | POA: Insufficient documentation

## 2016-07-23 DIAGNOSIS — E7800 Pure hypercholesterolemia, unspecified: Secondary | ICD-10-CM | POA: Insufficient documentation

## 2016-07-23 DIAGNOSIS — C3492 Malignant neoplasm of unspecified part of left bronchus or lung: Secondary | ICD-10-CM | POA: Insufficient documentation

## 2016-07-23 DIAGNOSIS — K219 Gastro-esophageal reflux disease without esophagitis: Secondary | ICD-10-CM | POA: Insufficient documentation

## 2016-07-23 DIAGNOSIS — I10 Essential (primary) hypertension: Secondary | ICD-10-CM | POA: Insufficient documentation

## 2016-07-23 DIAGNOSIS — R627 Adult failure to thrive: Secondary | ICD-10-CM | POA: Insufficient documentation

## 2016-07-23 DIAGNOSIS — Z9049 Acquired absence of other specified parts of digestive tract: Secondary | ICD-10-CM | POA: Insufficient documentation

## 2016-07-23 DIAGNOSIS — Z8711 Personal history of peptic ulcer disease: Secondary | ICD-10-CM | POA: Insufficient documentation

## 2016-07-23 DIAGNOSIS — R634 Abnormal weight loss: Secondary | ICD-10-CM | POA: Insufficient documentation

## 2016-07-23 MED ORDER — FENTANYL 12 MCG/HR TD PT72: 12.5000 ug | MEDICATED_PATCH | TRANSDERMAL | 0 refills | Status: AC

## 2016-07-23 MED ORDER — CLOTRIMAZOLE-BETAMETHASONE 1-0.05 % EX CREA: 1.0000 "application " | TOPICAL_CREAM | Freq: Two times a day (BID) | CUTANEOUS | 2 refills | Status: AC

## 2016-07-23 MED ORDER — ZINC OXIDE 13 % EX CREA: TOPICAL_CREAM | CUTANEOUS | 3 refills | Status: AC

## 2016-07-23 MED ORDER — ESOMEPRAZOLE MAGNESIUM 40 MG PO CPDR: 40.0000 mg | DELAYED_RELEASE_CAPSULE | Freq: Every day | ORAL | 3 refills | Status: AC

## 2016-07-23 NOTE — Assessment & Plan Note (Addendum)
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  Oncology history updated.  No role for labs today.  Chart is reviewed.  Hospitalizations noted.  Ongoing disorientation and confusion despite ongoing corticosteroid use for intracranial mets and antibiotics for possible UTI x 7+ days.  Suspect this is secondary to malignancy.  Patient and husband refuse XRT.  Other family members are advised that these wishes should be honored despite their differing opinion.  Unable to exam skin and pressure sore due to patient's inability to stand, despite maximum assist of one nurse, one aid, and myself.  I suspect rash is secondary to incontinence.  Rx for Lotrisone to treat any fungal infection of intertriginous area.  Rx for Zinc Oxide for barrier treatment due to incontinence. Rx for Nexium due to indigestion, likely secondary to corticosteroid use.  Rx for low-dose Fentanyl for headaches- 12.5 mcg.  Referral to Hospice, [facility].  Family wish for inpatient Hospice at [facility].  D/C I[place]a therapy.  Family quotes days-weeks of life given current situation.  No return appointment.  More than 50% of the time spent with the patient was utilized for counseling and coordination of care. >40 minutes is spent in face to face discussion with the patient and family.  50+ minutes is spent on entirety of encounter.

## 2016-07-23 NOTE — Patient Instructions (Signed)
[place] at [facility] Discharge Instructions  RECOMMENDATIONS MADE BY THE CONSULTANT AND ANY TEST RESULTS WILL BE SENT TO YOUR REFERRING PHYSICIAN.  You were seen today by Kirby Crigler PA-C. Referral made to Hospice. No follow up appointment needed.  Thank you for choosing [place] at [facility] to provide your oncology and hematology care.  To afford each patient quality time with our provider, please arrive at least 15 minutes before your scheduled appointment time.   Beginning January 23rd 2017 lab work for the [employer] will be done in the  Main lab at [facility] on 1st floor. If you have a lab appointment with the [facility] please come in thru the  Main Entrance and check in at the main information desk  You need to re-schedule your appointment should you arrive 10 or more minutes late.  We strive to give you quality time with our providers, and arriving late affects you and other patients whose appointments are after yours.  Also, if you no show three or more times for appointments you may be dismissed from the clinic at the providers discretion.     Again, thank you for choosing [facility].  Our hope is that these requests will decrease the amount of time that you wait before being seen by our physicians.       _____________________________________________________________  Should you have questions after your visit to [facility], please contact our office at [phone] between the hours of 8:30 a.m. and 4:30 p.m.  Voicemails left after 4:30 p.m. will not be returned until the following business day.  For prescription refill requests, have your pharmacy contact our office.         Resources For Cancer Patients and their Caregivers ? American Cancer Society: Can assist with transportation, wigs, general needs, runs Look Good Feel Better.        415 494 9891 ? Cancer Care: Provides  financial assistance, online support groups, medication/co-pay assistance.  1-800-813-HOPE (213)551-5194) ? [facility] Assists [place] cancer patients and their families through emotional , educational and financial support.  959-412-7024 ? [place] DSS Where to apply for food stamps, Medicaid and utility assistance. 548-501-4887 ? [employer]: Transportation to medical appointments. 323 237 3009 ? Social Security Administration: May apply for disability if have a Stage IV cancer. (415)766-8155 323 764 9830 ? [employer], Disability and Transit Services: Assists with nutrition, care and transit needs. [place] Support Programs: '@10RELATIVEDAYS'$ @ > Cancer Support Group  2nd Tuesday of the month 1pm-2pm, Journey Room  > Creative Journey  3rd Tuesday of the month 1130am-1pm, Journey Room  > Look Good Feel Better  1st Wednesday of the month 10am-12 noon, Journey Room (Call [place] to register [phone])

## 2016-07-23 NOTE — Progress Notes (Unsigned)
Referral to Hospice.  Records faxed on 12/8

## 2016-07-23 NOTE — Progress Notes (Signed)
Christina Labrum, MD Christina Estes [id number]  Adenocarcinoma of left lung [facility])  CURRENT THERAPY: Iressa 250 mg PO daily beginning on 05/21/2016  INTERVAL HISTORY: Christina Estes 81 y.o. 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    Adenocarcinoma of left lung ([place])   04/25/2016 - 05/01/2016 Hospital Admission    Urinary retention, AKI      04/26/2016 Initial Diagnosis    Adenocarcinoma of left lung ([place])     04/26/2016 Imaging    MRI Lumbar/thoracic spine: Widespread osseous metastases involving the lumbar spine.No evidence for cord compression. There is extraosseous extension of tumor at the right posterior aspect of the S2 segment, which could potentially affect the adjacent right sacral nerve roots. No other significant extraosseous extension of tumor. Probable pathologic fracture involving L2 vertebral body with mild 10% height loss without bony retropulsion.Findings consistent with widespread osseous metastases involving the thoracic spine. No significant extra osseous or epidural tumor at this time. No evidence for cord compression or severe canal Stenosis. Pathologic fracture involving the T4 vertebral body with associated 50% height loss without bony retropulsion.      04/29/2016 Initial Biopsy    Bronchoscopy with brushings, needle aspirations, and biopsies. With Dr. Roxan Hockey      04/30/2016 Imaging    MRI brain Over 80 cerebral and infratentorial brain metastases. No vasogenic edema. 2. Tiny cortical infarct in the right frontal lobe. 3. 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      04/30/2016 Pathology Results    Adenocarcinoma Guardant testing EGFR mutation, no tissue for tissue testing      05/19/2016 Pathology Results    Guardant360- 5 Somatic  alterations- EGFR, TP53, PIK3CA, BRCA2,       05/21/2016 - 07/23/2016 Chemotherapy    Iressa 250 mg PO daily       07/06/2016 Imaging    MRI brain- 1. Innumerable subcentimeter brain metastases without progression since 04/30/2016. Many of the dominant lesions appear slightly smaller now suggesting some response to treatment. No significant cerebral edema. No intracranial mass effect. 2. Several skull metastases also not significantly progressed since September. The largest of these in the right parietal bone appears to have some underlying dural involvement. 3. No new intracranial abnormality.      07/07/2016 Imaging    CT chest- 1. Since the CT of 04/20/2016, decreased size of a superior segment left lower lobe lung mass. 2. No thoracic adenopathy. 3. Other smaller pulmonary nodules are nonspecific and unchanged. 4. Widespread osseous metastasis, new since 04/20/2016 CT but described and detailed on the MRI of 04/26/2016.      07/14/2016 Imaging    CT head- 1. Innumerable punctate calcified lesions throughout the brain compatible metastatic disease. This may represent calcification following treatment. 2. No significant mass effect or large new lesions. 3. No acute intracranial abnormality.      07/14/2016 - 07/15/2016 Hospital Admission    Admit date: 07/14/2016 Admission diagnosis: Acute encephalopathy Additional comments: Acute encephalopathy. Possibly related to urinary tract infection. May also be related to increased dosage of steroids and dehydration. Since admission, her mental status has improved to baseline. Will decrease dose of steroids to 4 mg twice a day.  Possible urinary tract infection. Started on Rocephin. Urine culture pending at the time of discharge. Will empirically treat  with ciprofloxacin.Marland Kitchen      07/15/2016 - 07/16/2016 Hospital Admission    Admit date: 07/15/2016 Admission diagnosis: Acute encephalopathy Additional comments: Acute encephalopathy.  Mental status appears to be waxing and waning with periods of clarity followed by periods of confusion. Possibly related to recent dehydration/ urinary tract infection. Continue to monitor. At this time, she is awake, alert and answering questions appropriately. This may also be related to brain metastases.       Patient is here for follow-up from repeated hospitalizations and discussion regarding future goals of care.    She is now staying at the [facility] and the patient's daughter is concerned about the nursing care she is receiving.  She notes that Tristy is not being changed on a consistent basis and therefore she is finding the patient laying her in own excrements.  As a result, the patient has developed intertriginous rash.  The patient notes that it is pruritic and painful.    The patient's family also reports a pressure sore.  The patient admits to a recent decline in performance status.  She is no longer walking.  She is bedbound and evidence of this is the reported bedsore.  During discussion, there is conflicting family dynamics.  The patient's daughter and and son-in-law want her to re-try XRT to brain while the patient, her husband, and brother do not as that is her wish.  Those who are encouraging her to undergo XRT are advised that their opinions and desires do not carry any weight as the patient and adamantly report that she does not want to try XRT again.  The patients wants comfort care.  They want me to exam the patient's bottom today which I am glad to do.  Review of Systems  Constitutional: Negative for chills and fever.  Respiratory: Negative for cough.   Cardiovascular: Negative for chest pain.  Gastrointestinal: Negative for abdominal pain, constipation, diarrhea, nausea and vomiting.  Genitourinary: Negative for dysuria.  Neurological: Positive for weakness and headaches.    Past Medical History:  Diagnosis Date  . Adenocarcinoma of left lung ([place])  04/26/2016  . Anxiety   . Arthritis    "qwhere" (05/14/2016)  . Basal cell carcinoma    "arms, hands, face; some cut off/some burned off"  . Cancer ([place])    "I'm eat up w/cancer" (05/14/2016)  . Chronic back pain    "all over my back" (05/14/2016)  . Coronary artery disease   . Depression   . DOE (dyspnea on exertion)   . GERD (gastroesophageal reflux disease)   . History of blood transfusion    "when I was pregnant"  . History of hiatal hernia   . Hypercholesteremia   . Hypertension   . Lung cancer [facility]    pt son reports lung cancer then diagnosed with brain and bone metastisis.  . Lung mass 04/22/2016  . Malignant melanoma of arm ([place])    "left"  . Obesity   . PONV (postoperative nausea and vomiting)   . Type II diabetes mellitus ([place])     Past Surgical History:  Procedure Laterality Date  . BACK SURGERY     "mid back; don't know what they did" (05/14/2016)  . BREAST BIOPSY Right   . BREAST LUMPECTOMY Right    "not cancer"  . CARDIAC CATHETERIZATION  12/11/2003  . CARPAL TUNNEL RELEASE Bilateral   . CATARACT EXTRACTION W/ INTRAOCULAR LENS  IMPLANT, BILATERAL    . CHOLECYSTECTOMY OPEN    . DILATION AND  CURETTAGE OF UTERUS    . MELANOMA EXCISION Left    "upper arm"  . TONSILLECTOMY  ~ 1944  . VAGINAL HYSTERECTOMY    . VIDEO BRONCHOSCOPY N/A 04/30/2016   Procedure: VIDEO BRONCHOSCOPY;  Surgeon: [name], MD;  Location: [facility] OR;  Service: Thoracic;  Laterality: N/A;    Family History  Problem Relation Age of Onset  . Cancer Mother   . Diabetes Mellitus II Son   . Cancer Brother     Social History   Social History  . Marital status: Married    Spouse name: N/A  . Number of children: N/A  . Years of education: N/A   Social History Main Topics  . Smoking status: Never Smoker  . Smokeless tobacco: Never Used  . Alcohol use No  . Drug use: No  . Sexual activity: Not Currently   Other Topics Concern  . None   Social History Narrative  . None      PHYSICAL EXAMINATION  ECOG PERFORMANCE STATUS: 4 - Bedbound  Vitals:   07/23/16 1006  BP: (!) 159/92  Pulse: 97  Resp: 18    GENERAL:alert, ill looking, obese and accompanied by husband, brother, daughter, and son-in-law. SKIN: unable to exam skin due to patient's inability to stand. HEAD: Normocephalic EYES: EOMI EARS: External ears normal OROPHARYNX:not examined  NECK: supple, trachea midline LYMPH:  not examined BREAST:not examined LUNGS: not examined HEART: not examined ABDOMEN:not examined BACK: Back symmetric, no curvature. EXTREMITIES:no skin discoloration, no cyanosis  NEURO: positive findings: confused, not oriented to place or time.   LABORATORY DATA: CBC    Component Value Date/Time   WBC 9.1 07/15/2016 2319   RBC 4.05 07/15/2016 2319   HGB 11.9 (L) 07/15/2016 2319   HCT 35.2 (L) 07/15/2016 2319   PLT 124 (L) 07/15/2016 2319   MCV 86.9 07/15/2016 2319   MCH 29.4 07/15/2016 2319   MCHC 33.8 07/15/2016 2319   RDW 18.1 (H) 07/15/2016 2319   LYMPHSABS 1.0 07/15/2016 2319   MONOABS 0.4 07/15/2016 2319   EOSABS 0.0 07/15/2016 2319   BASOSABS 0.0 07/15/2016 2319      Chemistry      Component Value Date/Time   NA 134 (L) 07/15/2016 2319   K 3.2 (L) 07/15/2016 2319   CL 94 (L) 07/15/2016 2319   CO2 27 07/15/2016 2319   BUN 32 (H) 07/15/2016 2319   CREATININE 0.94 07/15/2016 2319      Component Value Date/Time   CALCIUM 8.3 (L) 07/15/2016 2319   ALKPHOS 109 07/15/2016 2319   AST 33 07/15/2016 2319   ALT 49 07/15/2016 2319   BILITOT 0.5 07/15/2016 2319        PENDING LABS:   RADIOGRAPHIC STUDIES:  Ct Head Wo Contrast  Result Date: 07/14/2016 CLINICAL DATA:  Altered mental status. Lung cancer with metastatic disease to the brain. EXAM: CT HEAD WITHOUT CONTRAST TECHNIQUE: Contiguous axial images were obtained from the base of the skull through the vertex without intravenous contrast. COMPARISON:  MRI brain 07/06/2016 and 04/30/2016.  FINDINGS: Brain: Innumerable calcified metastatic lesions are again noted. There is no acute hemorrhage or significant mass effect. Ventricles are normal size. No significant extra-axial fluid collection is present. No acute infarct is present. Mild atrophy and diffuse white matter disease is noted bilaterally. The basal ganglia are intact. Vascular: Cavernous internal carotid artery calcifications are asymmetric on the right. There is no hyperdense vessel. Skull: Known lytic lesions are again seen within the calvarium. No new  lesions are present. Sinuses/Orbits: The paranasal sinuses and mastoid air cells are clear. Bilateral lens replacements are noted. The globes and orbits are intact. IMPRESSION: 1. Innumerable punctate calcified lesions throughout the brain compatible metastatic disease. This may represent calcification following treatment. 2. No significant mass effect or large new lesions. 3. No acute intracranial abnormality. Electronically Signed   By: San Morelle M.D.   On: 07/14/2016 12:17   Ct Chest W Contrast  Result Date: 07/07/2016 CLINICAL DATA:  Adenocarcinoma of the left lung diagnosed 9/17. Radiation therapy. Restaging. Brain metastasis. EXAM: CT CHEST WITH CONTRAST TECHNIQUE: Multidetector CT imaging of the chest was performed during intravenous contrast administration. CONTRAST:  55m ISOVUE-300 IOPAMIDOL (ISOVUE-300) INJECTION 61% COMPARISON:  Chest radiograph 05/13/2016.  CT of 04/20/2016 FINDINGS: Cardiovascular: Aortic and branch vessel atherosclerosis. Tortuous thoracic aorta. Mild cardiomegaly, without pericardial effusion. Multivessel coronary artery atherosclerosis. No central pulmonary embolism, on this non-dedicated study. Mediastinum/Nodes: No supraclavicular adenopathy. No mediastinal or hilar adenopathy. Lungs/Pleura: No pleural fluid. Subpleural right lower lobe vague 5 mm nodule on image 83/ series 3 is similar. More cephalad right lower lobe 6 mm subpleural nodule  on image 70/series 3 is also not significantly changed. Minimal left upper lobe nodularity on image 59/series 3 is unchanged. Superior segment left lower lobe lung mass measures 3.5 x 3.0 cm on image 78/series 3. Compare 5.0 x 4.2 cm on the prior. Minimal more lateral superior segment left lower lobe nodularity is likely related to the same process and at the site of the dominant mass on the prior. Upper Abdomen: Possible irregular hepatic capsule, including on image 169/series 2. Normal imaged portions of the spleen, stomach, pancreas, adrenal glands, left kidney. Aortic and branch vessel atherosclerosis. Musculoskeletal: Development of multi focal sclerotic osseous metastasis. Example in the right humeral head at 1.8 cm. Compression deformity at T4 persists. There is a superior endplate compression deformity at L1 which is new. IMPRESSION: 1. Since the CT of 04/20/2016, decreased size of a superior segment left lower lobe lung mass. 2. No thoracic adenopathy. 3. Other smaller pulmonary nodules are nonspecific and unchanged. 4. Widespread osseous metastasis, new since 04/20/2016 CT but described and detailed on the MRI of 04/26/2016. 5.  Coronary artery atherosclerosis. Aortic atherosclerosis. 6. Possible mild cirrhosis.  Correlate with risk factors. Electronically Signed   By: KAbigail MiyamotoM.D.   On: 07/07/2016 09:44   Mr BJeri CosWYQContrast  Result Date: 07/06/2016 CLINICAL DATA:  81year old female with confusion and memory loss for 3 weeks. Left lung adenocarcinoma with extensive metastatic disease to the brain demonstrated in September. Per the medical record the patient may have had some radiation treatment since brain metastases were diagnosed, but not the full course. Patient opted for Hospice. Subsequent encounter. EXAM: MRI HEAD WITHOUT AND WITH CONTRAST TECHNIQUE: Multiplanar, multiecho pulse sequences of the brain and surrounding structures were obtained without and with intravenous contrast.  CONTRAST:  125mMULTIHANCE GADOBENATE DIMEGLUMINE 529 MG/ML IV SOLN COMPARISON:  Brain MRI 04/30/2016. FINDINGS: Brain: Innumerable sub-centimeter enhancing brain metastases re- demonstrated. In general all of the largest brain metastases appear slightly smaller than on 04/26/2016. No areas of significant edema. No intracranial mass effect. Osseous metastatic disease involving the calvarium with mild associated dural thickening (e.g. Right parietal convexity series 12, image 57). The skull osseous metastases also have not significantly progressed. There is no leptomeningeal metastasis identified. No restricted diffusion to suggest acute infarction. No midline shift, mass effect, ventriculomegaly, extra-axial fluid collection or acute intracranial hemorrhage. Cervicomedullary junction  and pituitary are within normal limits. Vascular: Major intracranial vascular flow voids are stable and within normal limits. Skull and upper cervical spine: Grossly negative visualized cervical spinal cord. Occipital condyles and visible upper cervical spine bone marrow signal remains normal. Sinuses/Orbits: Stable and negative. Other: Visible internal auditory structures appear normal. Negative scalp soft tissues. IMPRESSION: 1. Innumerable subcentimeter brain metastases without progression since 04/30/2016. Many of the dominant lesions appear slightly smaller now suggesting some response to treatment. No significant cerebral edema. No intracranial mass effect. 2. Several skull metastases also not significantly progressed since September. The largest of these in the right parietal bone appears to have some underlying dural involvement. 3. No new intracranial abnormality. Electronically Signed   By: Genevie Ann M.D.   On: 07/06/2016 20:18   Dg Chest Port 1 View  Result Date: 07/14/2016 CLINICAL DATA:  Altered mental status. EXAM: PORTABLE CHEST 1 VIEW COMPARISON:  05/13/2016 FINDINGS: Heart is normal size. No confluent airspace  opacities or effusions. No acute bony abnormality. IMPRESSION: No active disease. Electronically Signed   By: Rolm Baptise M.D.   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  Oncology history updated.  No role for labs today.  Chart is reviewed.  Hospitalizations noted.  Ongoing disorientation and confusion despite ongoing corticosteroid use for intracranial mets and antibiotics for possible UTI x 7+ days.  Suspect this is secondary to malignancy.  Patient and husband refuse XRT.  Other family members are advised that these wishes should be honored despite their differing opinion.  Unable to exam skin and pressure sore due to patient's inability to stand, despite maximum assist of one nurse, one aid, and myself.  I suspect rash is secondary to incontinence.  Rx for Lotrisone to treat any fungal infection of intertriginous area.  Rx for Zinc Oxide for barrier treatment due to incontinence. Rx for Nexium due to indigestion, likely secondary to corticosteroid use.  Rx for low-dose Fentanyl for headaches- 12.5 mcg.  Referral to Hospice, [facility].  Family wish for inpatient Hospice at [facility].  D/C Iressa therapy.  Family quotes days-weeks of life given current situation.  No return appointment.  More than 50% of the time spent with the patient was utilized for counseling and coordination of care. >40 minutes is spent in face to face discussion with the patient and family.  50+ minutes is spent on entirety of encounter.   ORDERS PLACED FOR THIS ENCOUNTER: No orders of the defined types were placed in this encounter.   MEDICATIONS PRESCRIBED THIS ENCOUNTER: Meds ordered this encounter  Medications  . Zinc Oxide (DESITIN) 13 % CREA    Sig: Apply  four times daily to rash, perineal area.    Dispense:  1 Tube    Refill:  3    Order Specific Question:   Supervising Provider    Answer:   Patrici Ranks [id number]  . clotrimazole-betamethasone (LOTRISONE) cream    Sig: Apply 1 application topically 2 (two) times daily.    Dispense:  30 g    Refill:  2    Order Specific Question:   Supervising Provider    Answer:   Patrici Ranks [id number]  . fentaNYL (DURAGESIC - DOSED MCG/HR) 12 MCG/HR    Sig: Place 1 patch (12.5  mcg total) onto the skin every 3 (three) days.    Dispense:  10 patch    Refill:  0    Order Specific Question:   Supervising Provider    Answer:   Patrici Ranks [id number]  . esomeprazole (NEXIUM) 40 MG capsule    Sig: Take 1 capsule (40 mg total) by mouth daily at 12 noon.    Dispense:  30 capsule    Refill:  3    Order Specific Question:   Supervising Provider    Answer:   Patrici Ranks [id number]    THERAPY PLAN:  D/C treatment.  Refer to Hospice.  All questions were answered. The patient knows to call the clinic with any problems, questions or concerns. We can certainly see the patient much sooner if necessary.  Patient and plan discussed with Dr. Ancil Linsey and she is in agreement with the aforementioned.   This note is electronically signed by: Doy Mince 07/23/2016 3:35 PM

## 2016-07-28 ENCOUNTER — Ambulatory Visit (HOSPITAL_COMMUNITY): Payer: Commercial Managed Care - HMO | Admitting: Oncology

## 2016-08-03 ENCOUNTER — Encounter: Payer: Self-pay | Admitting: Licensed Clinical Social Worker

## 2016-08-06 ENCOUNTER — Other Ambulatory Visit: Payer: Self-pay | Admitting: Licensed Clinical Social Worker

## 2016-08-06 ENCOUNTER — Encounter: Payer: Self-pay | Admitting: Licensed Clinical Social Worker

## 2016-08-06 NOTE — Patient Outreach (Signed)
Assessment:  CSW spoke via phone with Christina Estes, social worker at [facility] facility in [place], [place].  Christina Estes had been receiving care at that facility recently. Christina Estes had been receiving nursing care and physical therapy sessions as scheduled for client at facility.  Christina Estes informed CSW that Christina Estes recently discharged from [facility] facility and admitted directly to [facility] [place], [place].  Christina Estes reported that client was receiving care now at [facility] of [place], [place].  Christina Estes thanked Christina Estes for this information regarding Christina Estes.  Since client was admitted to [facility] [place], [place] recenlty, Christina Estes will discharge Christina Estes from [facility] services on 08/06/16.   Plan:  CSW is discharging Christina Estes from [facility] CSW services on 08/06/16 since client recently admitted to [facility] of [place], [place].  CSW to inform Christina Estes that Christina Estes discharged client from [place] services on 08/06/16.  CSW to fax physician case closure letter to Christina Estes informing Christina Estes that Christina Estes discharged client from [facility] CSW services on 08/06/16.  Christina Estes.Christina Estes MSW, LCSW Licensed Clinical Social Worker [facility] Care Management [phone]

## 2016-08-09 ENCOUNTER — Encounter (HOSPITAL_COMMUNITY): Payer: Self-pay | Admitting: Hematology & Oncology

## 2016-08-16 DEATH — deceased

## 2017-12-10 IMAGING — MR MR THORACIC SPINE W/O CM
5 of 13 series · 16 of 48 positions shown · non-contrast
Comparison: None.

CLINICAL DATA: Initial evaluation for gradually worsening urinary
tension for 3-4 days. Also with increasing back pain for several
weeks. Recently diagnosed with lung cancer. With known pathologic
fracture.

EXAM:
MRI THORACIC AND LUMBAR SPINE WITHOUT CONTRAST
TECHNIQUE: Multiplanar and multiecho pulse sequences of the thoracic and lumbar
spine were obtained without intravenous contrast.

[Series 5: T2 · sagittal · 3.0mm · 0.66mm/px · 2 of 15 slices shown (1 of 5)]
[im 1/15]
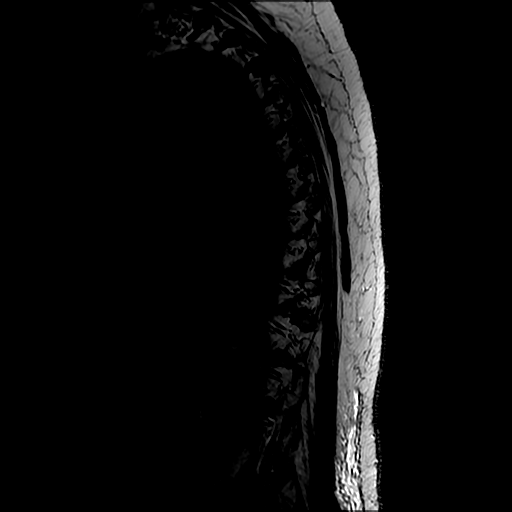
[im 15/15]
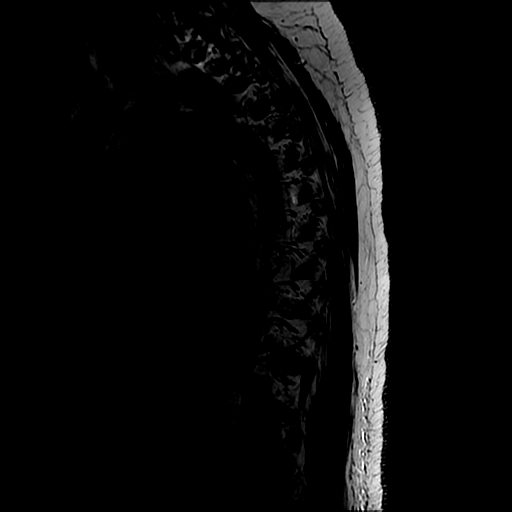

[Series 9: T2 · axial · 4.0mm · 0.39mm/px · z∈[-106,-26]mm · 3 of 18 slices shown (2 of 5)]
[im 1/18]
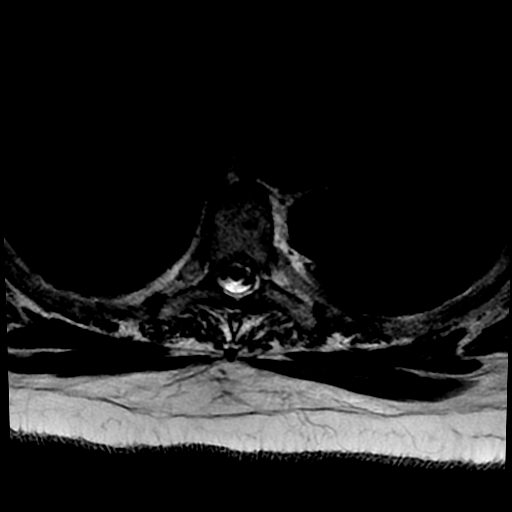
[im 9/18]
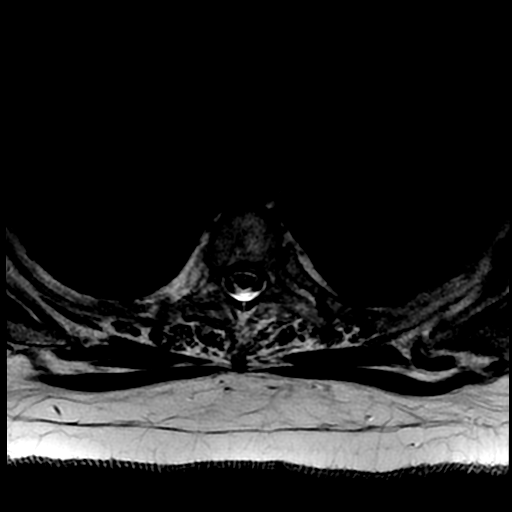
[im 18/18]
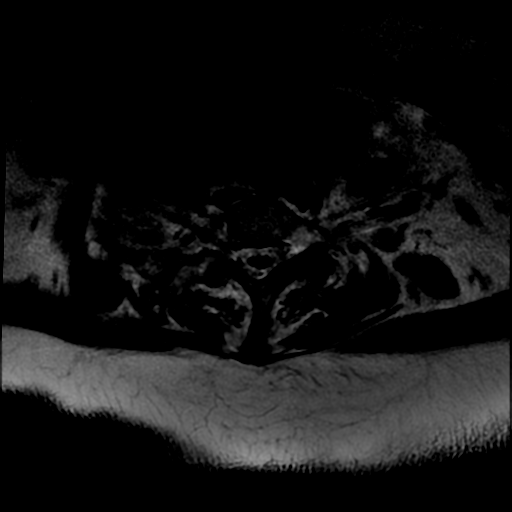

[Series 10: T2 · axial · 4.0mm · 0.39mm/px · z∈[-257,-42]mm · 6 of 40 slices shown (3 of 5)]
[im 1/40]
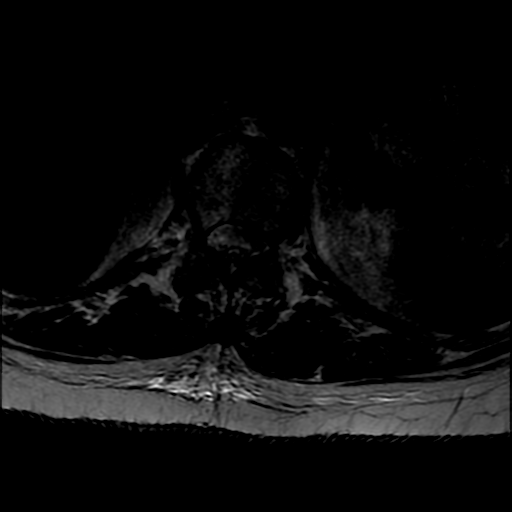
[im 8/40]
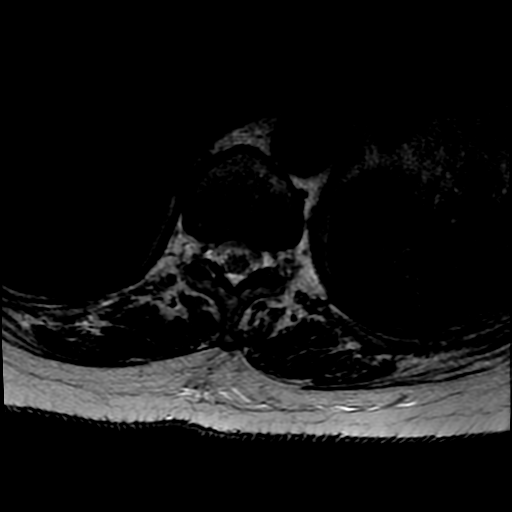
[im 16/40]
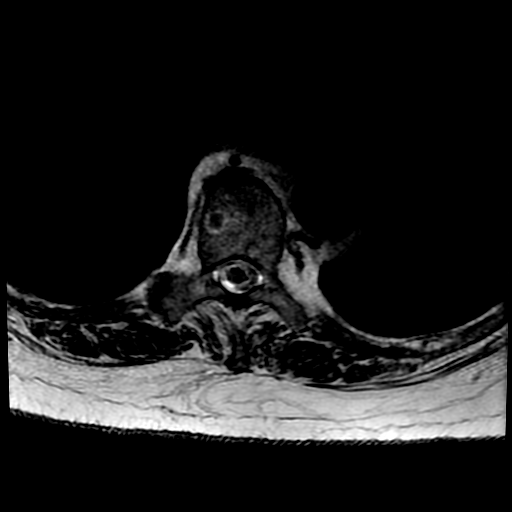
[im 24/40]
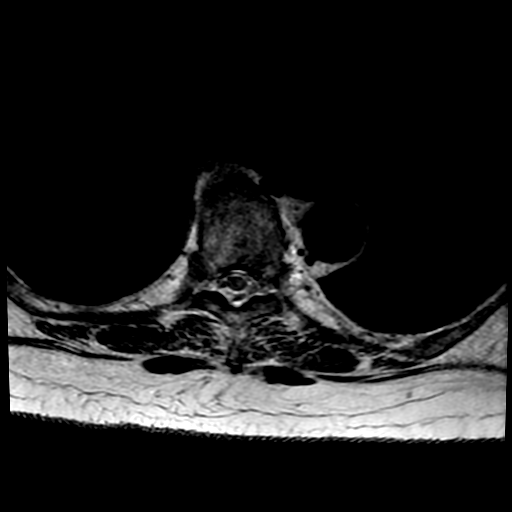
[im 32/40]
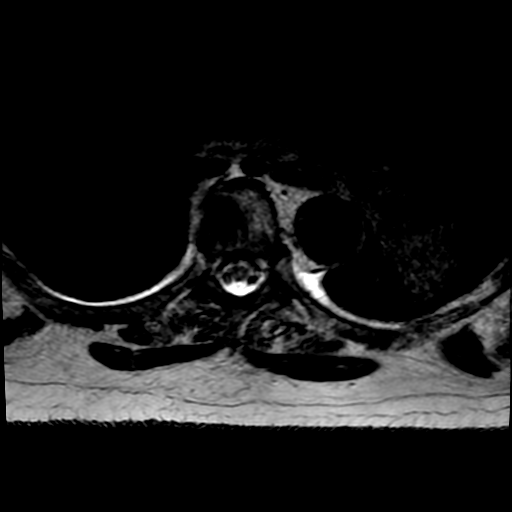
[im 40/40]
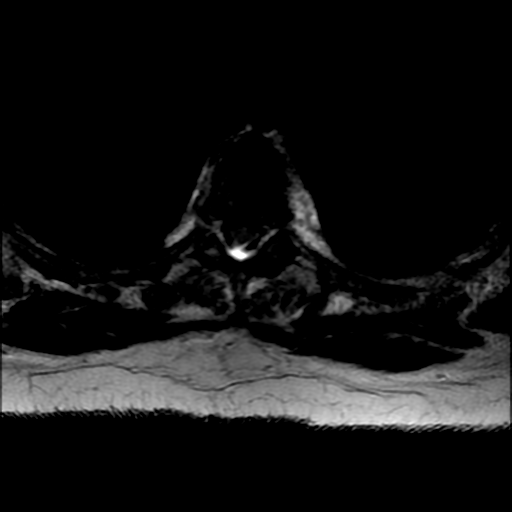

[Series 14: T2 · sagittal · 4.0mm · 0.55mm/px · 3 of 17 slices shown (4 of 5)]
[im 1/17]
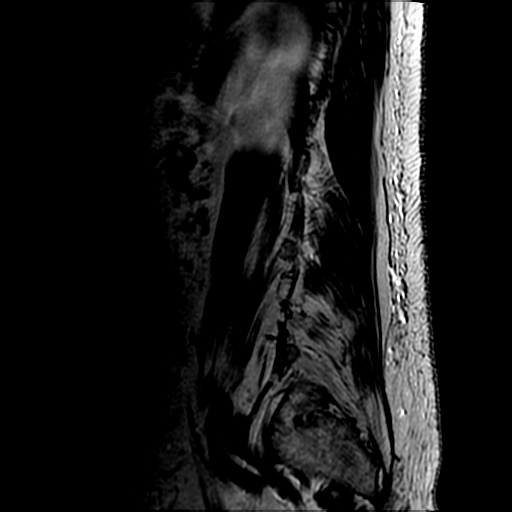
[im 9/17]
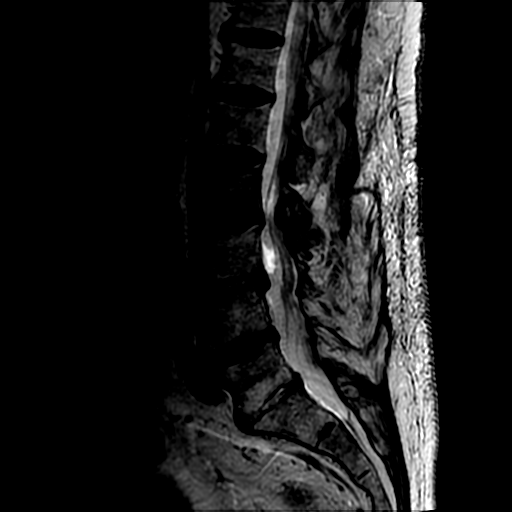
[im 17/17]
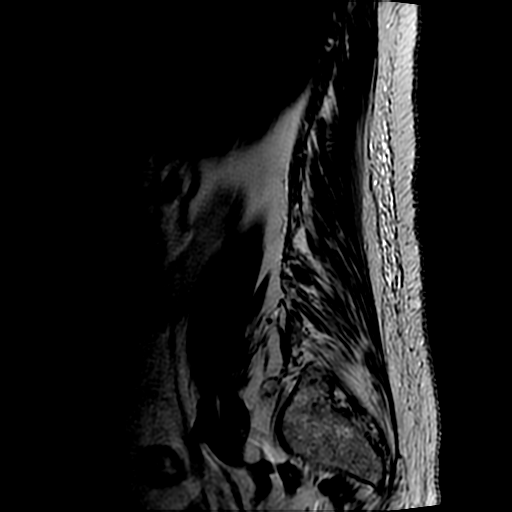

[Series 17: T2 · axial · 4.0mm · 0.39mm/px · z∈[-469,-435]mm · 2 of 48 slices shown (5 of 5)]
[im 1/48]
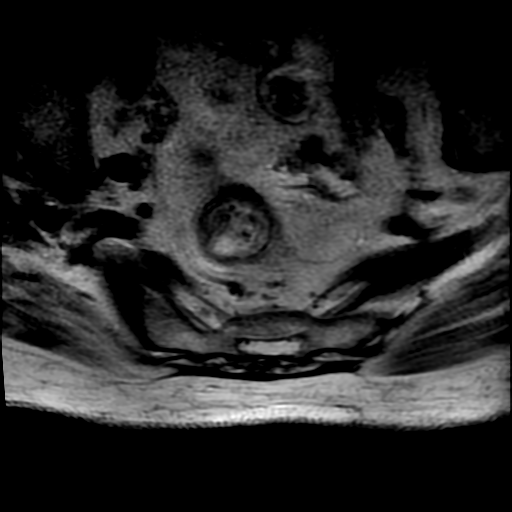
[im 8/48]
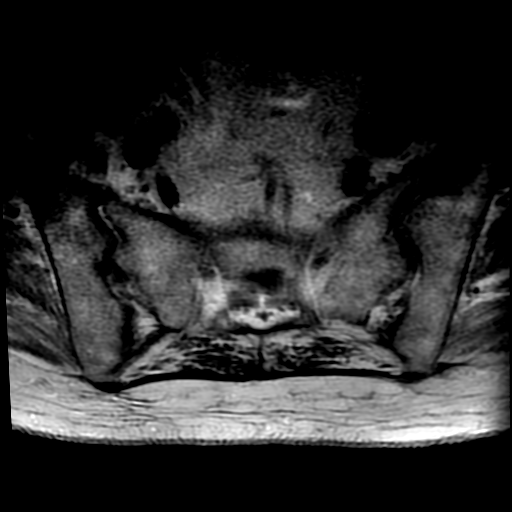

[16 of 48 positions shown; findings below may reference images not displayed]

FINDINGS: MRI THORACIC SPINE FINDINGS

Alignment: Vertebral bodies normally aligned with preservation of
the normal thoracic kyphosis. No listhesis.

Vertebrae: Views of the cervical spine on the counter sequence
demonstrate metastatic lesion within the C5 vertebral body. Mild
multilevel degenerative changes without significant stenosis.

Abnormal T1 hypo intense, mild stir hyperintense lesions seen
throughout the thoracic spine, compatible with metastatic disease.
Most notable lesion within the T4 vertebral body which is largely
replaced by tumor. There is an associated pathologic fracture with
approximately 50% height loss. No significant bony retropulsion.
Vertebral body heights otherwise maintained without additional
pathologic fracture.

Other prominent lesions present within the T1, T2, T6, T7, T8
comment T10, and L1 vertebral bodies. Probable small amount of
extraosseous extension of tumor into the left T1-2 (series 5, image
13), T3-4 neural foramen (series 5, image 11) as well as T7-8
(series 5, image 10) no other significant extraosseous extension of
tumor. Tumor noted within the right posterior eighth rib.

Cord: Signal intensity within the thoracic spinal cord is normal. No
significant epidural tumor. No evidence for cord compression.

Paraspinal and other soft tissues: Paraspinous soft tissues
demonstrate no acute abnormality. Partially visualized lungs are
clear.

Disc levels:

Mild degenerative disc bulging noted at T1-2 and T12-L1 without
stenosis. No other significant degenerative changes present within
the thoracic spine. No significant canal stenosis.

MRI LUMBAR SPINE FINDINGS

Segmentation: Normal segmentation. Lowest well-formed disc labeled
L5-S1.

Alignment: Vertebral bodies normally aligned with preservation of
the normal lumbar lordosis. No listhesis.

Vertebrae: Multiple wall osseous metastases are seen involving the
lumbar spine and visualized sacrum. The most prominent of these is
at L2 over the right aspect of the L2 vertebral body is largely
replaced by tumor. Mild height loss at the superior endplate of L2
of approximately 10% suspicious for associated pathologic fracture.
No bony retropulsion. Vertebral body heights otherwise maintained.
Additional osseous metastasis present at nearly all levels, but most
prominent at T12, L4 and the sacrum. There is extra osseous
extension of tumor at the S2 segment, which could potentially affect
the right sacral nerve roots at this level (series 15, image 6 6 on
axial sagittal sequence, series 17, image 42 on axial sequence). No
other significant extra osseous extension of tumor appreciated.
Additional osseous metastasis noted within the partially visualized
bones of the pelvis.

Conus medullaris: Extends to the T12-L1 level and appears normal. No
significant epidural tumor within the lumbar spine. No evidence for
cord compression.

Paraspinal and other soft tissues: Paraspinous soft tissues
demonstrate no acute abnormality. Remote postsurgical changes noted
within the lower back. Few scattered T2 hyperintense cyst noted
within the visualized kidneys.

Disc levels:

L1-2: Disc desiccation without significant disc bulge. No stenosis.

L2-3: Mild diffuse disc bulge with disc desiccation. Bilateral facet
arthrosis with ligamentum flavum hypertrophy. Resultant moderate
canal stenosis. No significant foraminal narrowing.

L3-4: Degenerative intervertebral disc space narrowing with disc
desiccation and diffuse disc bulge. Patient is status post remote
decompressive laminectomy. No residual stenosis. Left worse than
right facet arthrosis. Mild bilateral foraminal stenosis.

L4-5: Diffuse disc bulge with disc desiccation and intervertebral
disc space narrowing. Remote decompressive laminectomy. Mild bile
facet hypertrophy. No residual canal stenosis. Moderate left
foraminal narrowing.

L5-S1: Degenerative intervertebral disc space narrowing with disc
desiccation and mild diffuse disc bulge. Mild facet arthrosis. No
canal or foraminal stenosis.
IMPRESSION: MR THORACIC SPINE IMPRESSION

1. Findings consistent with widespread osseous metastases involving
the thoracic spine. No significant extra osseous or epidural tumor
at this time. No evidence for cord compression or severe canal
stenosis.
2. Pathologic fracture involving the T4 vertebral body with
associated 50% height loss without bony retropulsion.

MR LUMBAR SPINE IMPRESSION

1. Widespread osseous metastases involving the lumbar spine.
2. No evidence for cord compression. There is extraosseous extension
of tumor at the right posterior aspect of the S2 segment, which
could potentially affect the adjacent right sacral nerve roots. No
other significant extraosseous extension of tumor.
3. Probable pathologic fracture involving L2 vertebral body with
mild 10% height loss without bony retropulsion.
4. Multilevel degenerative spondylolysis and facet arthrosis as
above, most prevalent at the L2-3 level were there is moderate canal
stenosis. Patient is status post remote decompressive laminectomy at
L3-4 and L4-5. Please see above report for a full description of
these findings.

## 2017-12-10 IMAGING — DX DG ABDOMEN ACUTE W/ 1V CHEST
3 series · 3 of 3 positions shown · non-contrast
Comparison: None.  No prior exams for comparison.

CLINICAL DATA: Abdominal pain and constipation. Back pain. Recent
diagnosis of lung cancer.

EXAM:
DG ABDOMEN ACUTE W/ 1V CHEST

[chest pa]
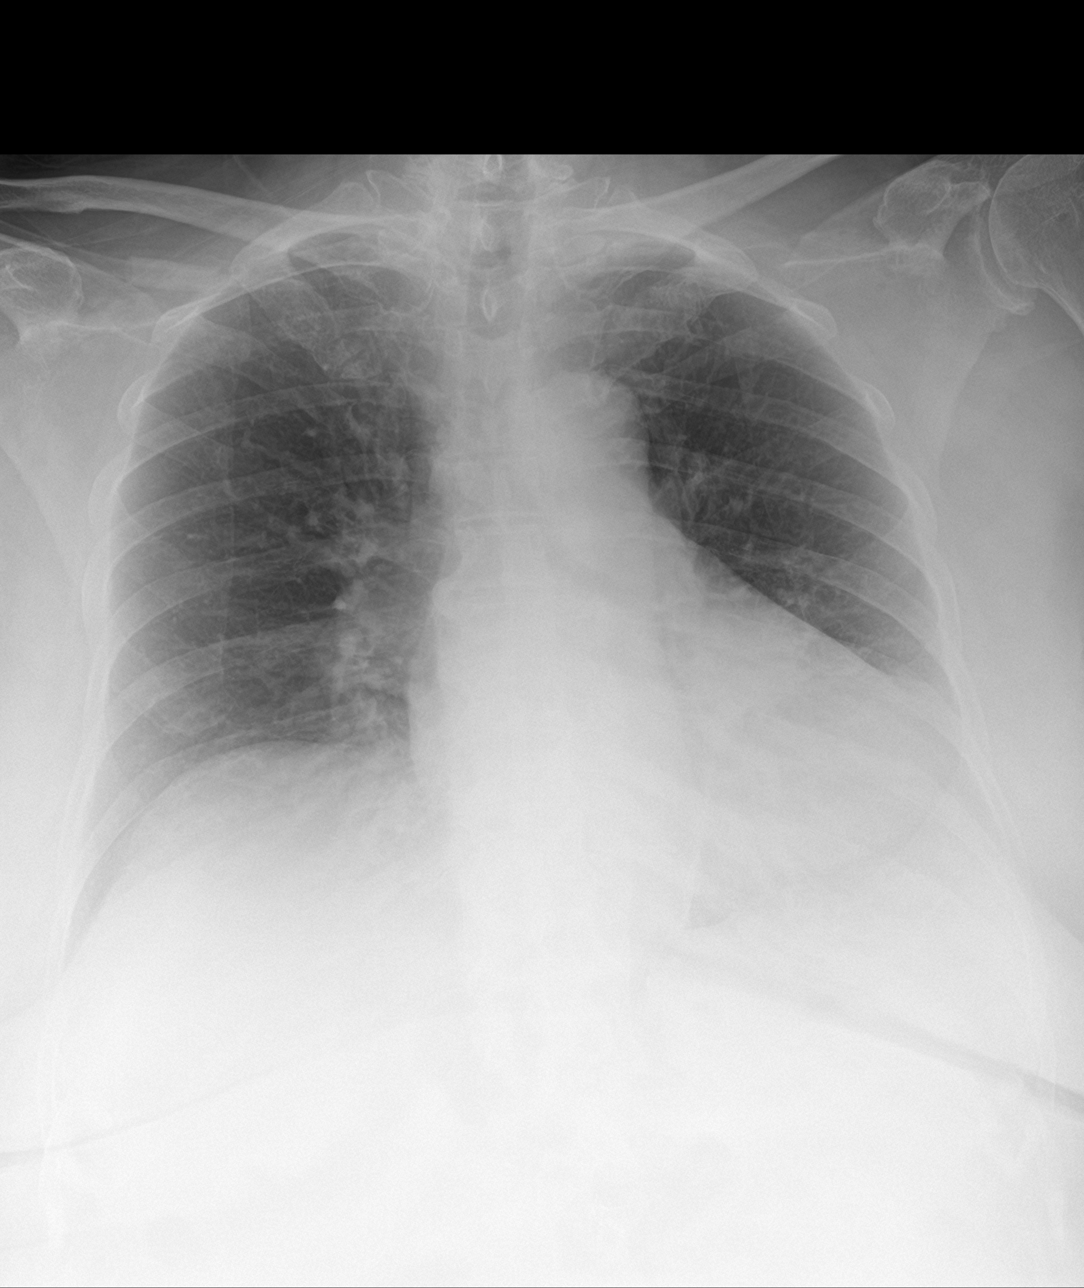

[abdomen erect]
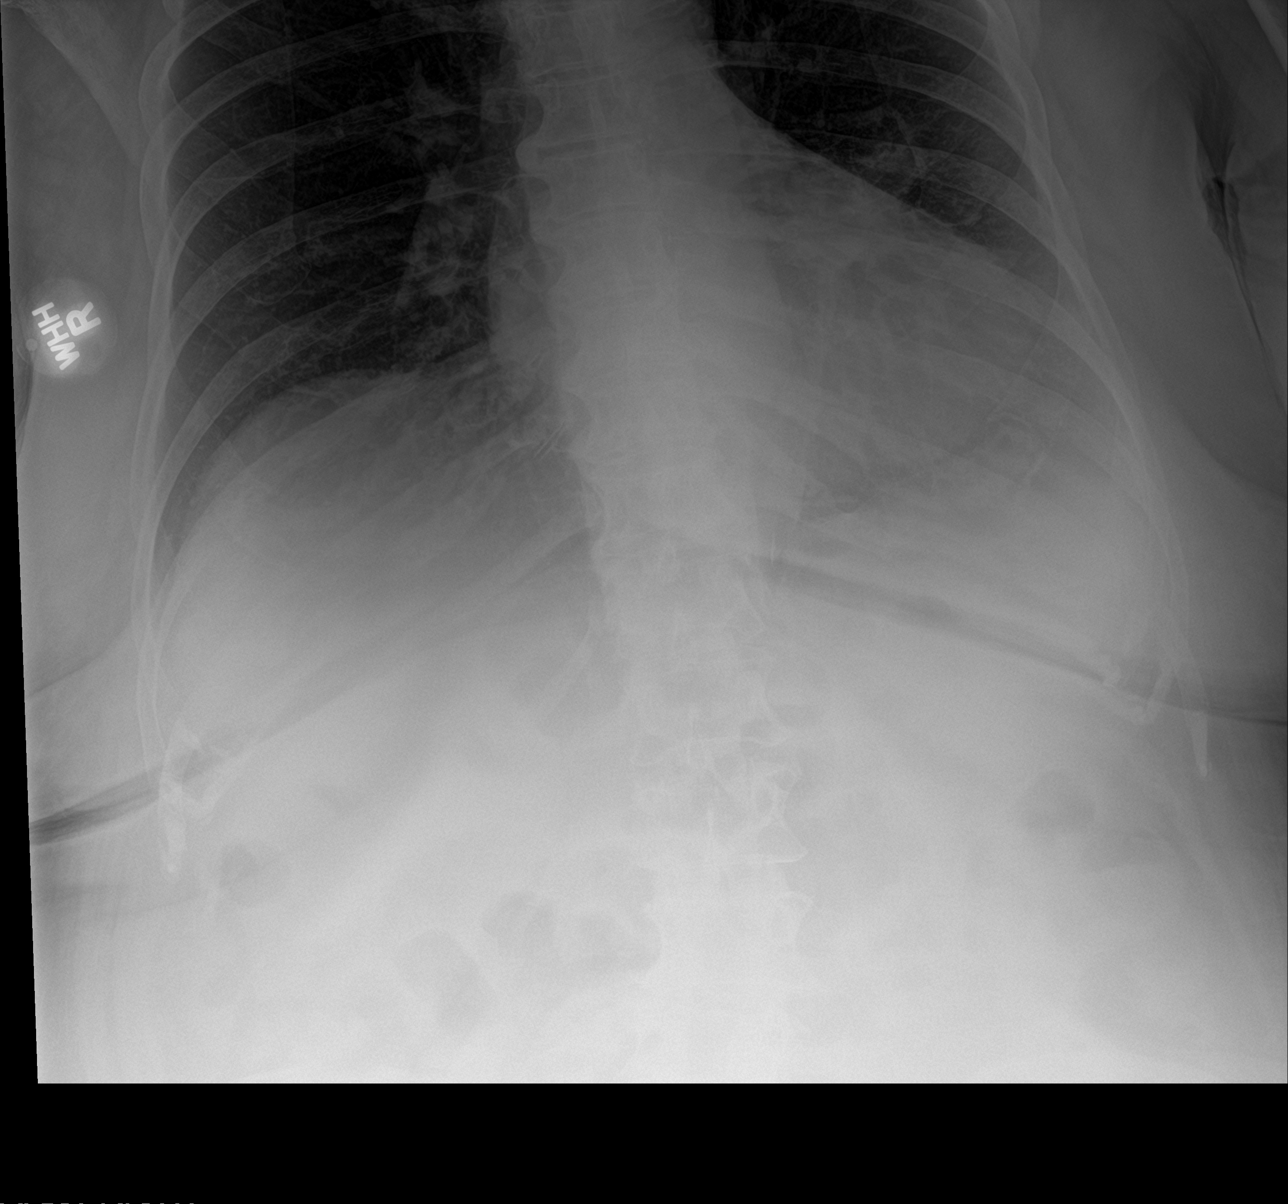

[abdomen supine]
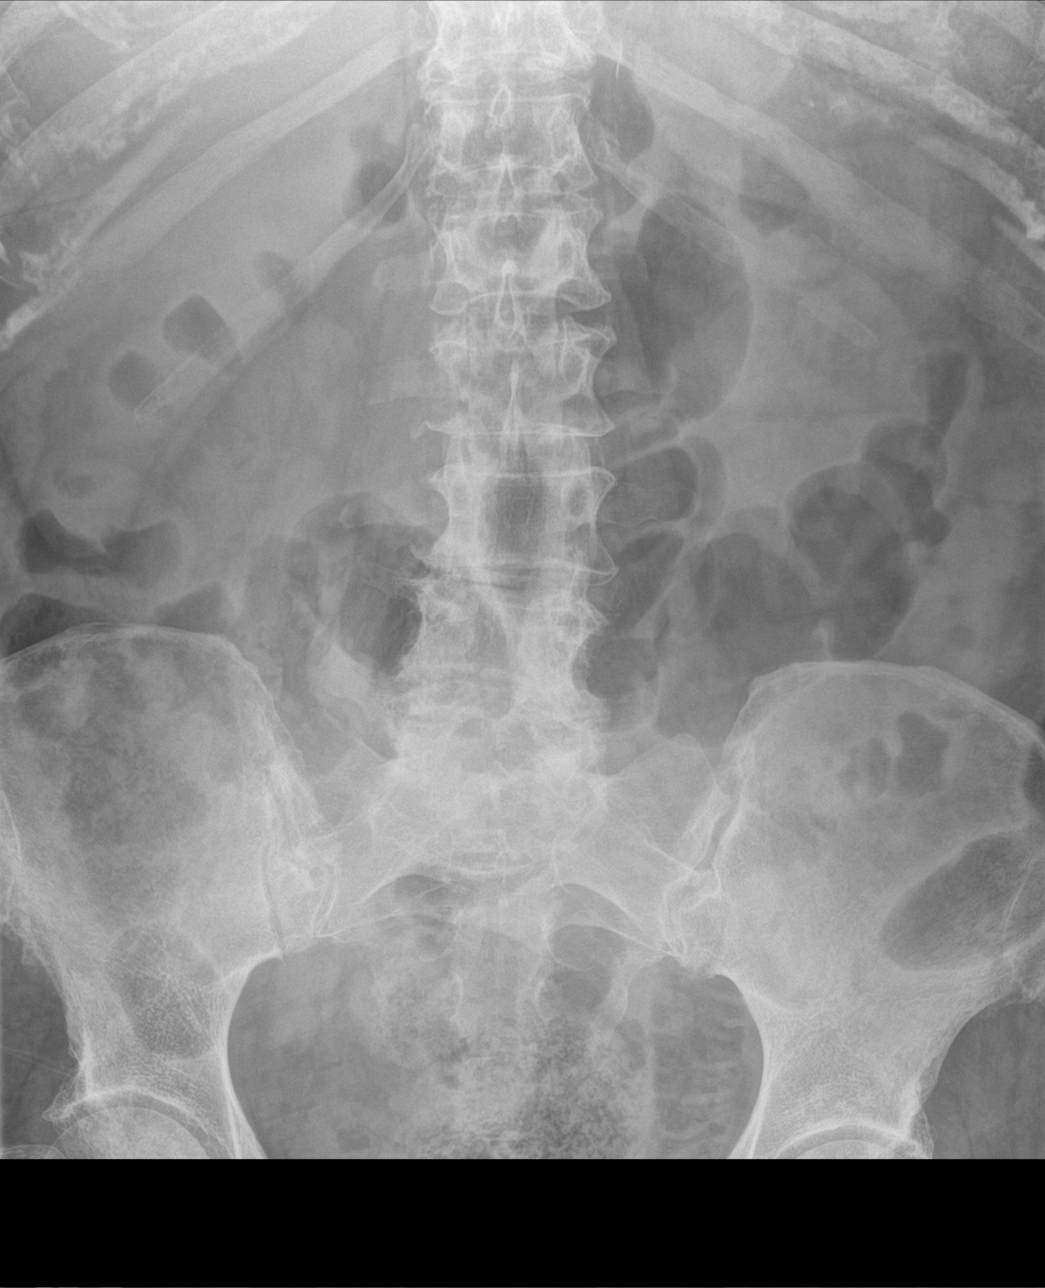

[3 of 3 positions shown; findings below may reference images not displayed]

FINDINGS: There is cardiomegaly. Prominent left perihilar soft tissue. Left
lung base opacity is nonspecific. No definite pleural fluid. Right
lung is grossly clear.

No free intra-abdominal air. No dilated bowel loops to suggest
obstruction. There is increased stool in the distal sigmoid colon
and rectum. No evidence of radiopaque calculi.

No acute osseous abnormalities seen radiographically.
IMPRESSION: 1. Increased stool in the distal sigmoid colon and rectum,
suggesting constipation. No bowel obstruction.
2. Left perihilar and left lung base opacity. This is nonspecific.
Recommend correlation with outside imaging given recent diagnosis of
lung cancer.

## 2017-12-10 IMAGING — DX DG THORACIC SPINE 2V
3 series · 3 of 3 positions shown · non-contrast
Comparison: None.

CLINICAL DATA: Back pain, abdominal pain, constipation. Recent
diagnosis of lung cancer.

EXAM:
THORACIC SPINE 2 VIEWS

[t-spine ap]
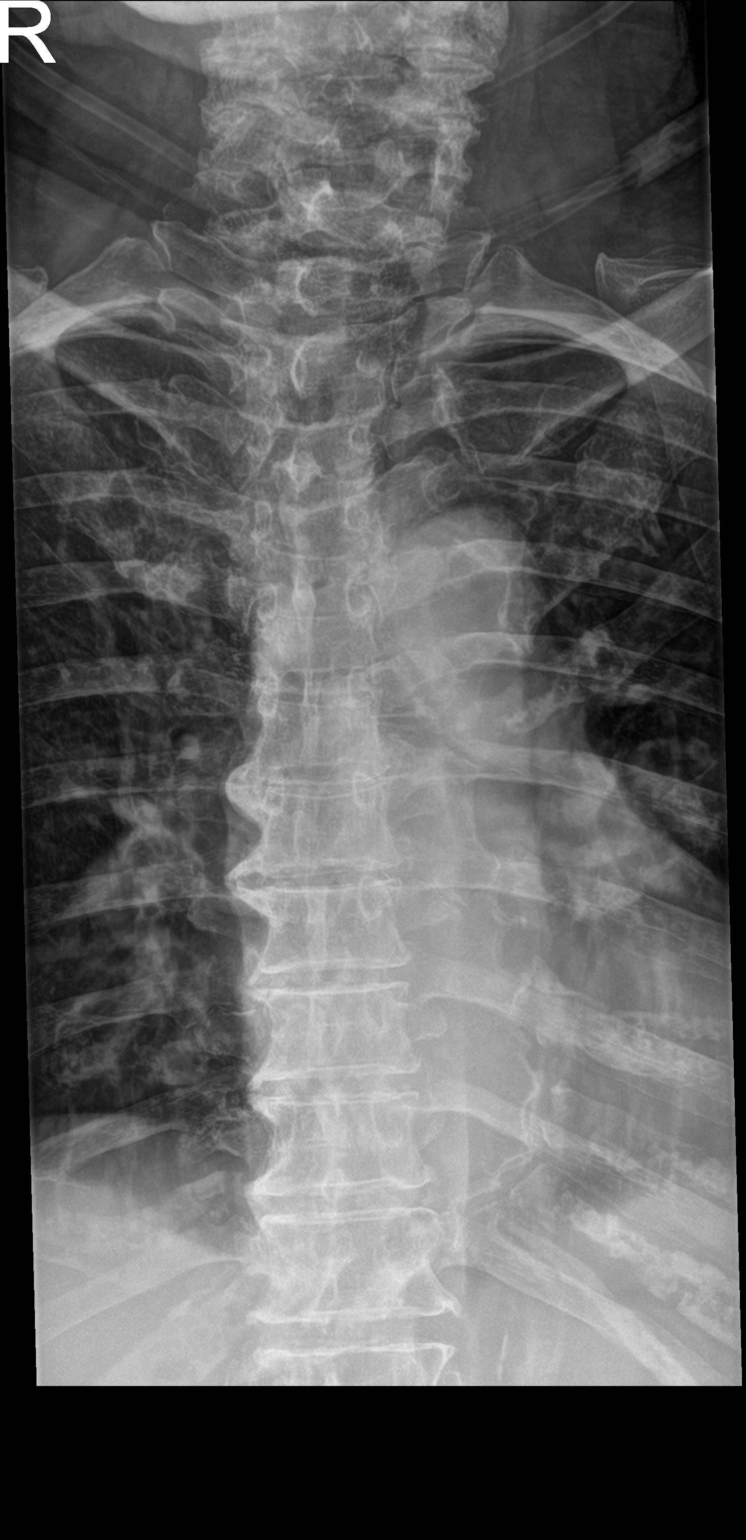

[t-spine lat]
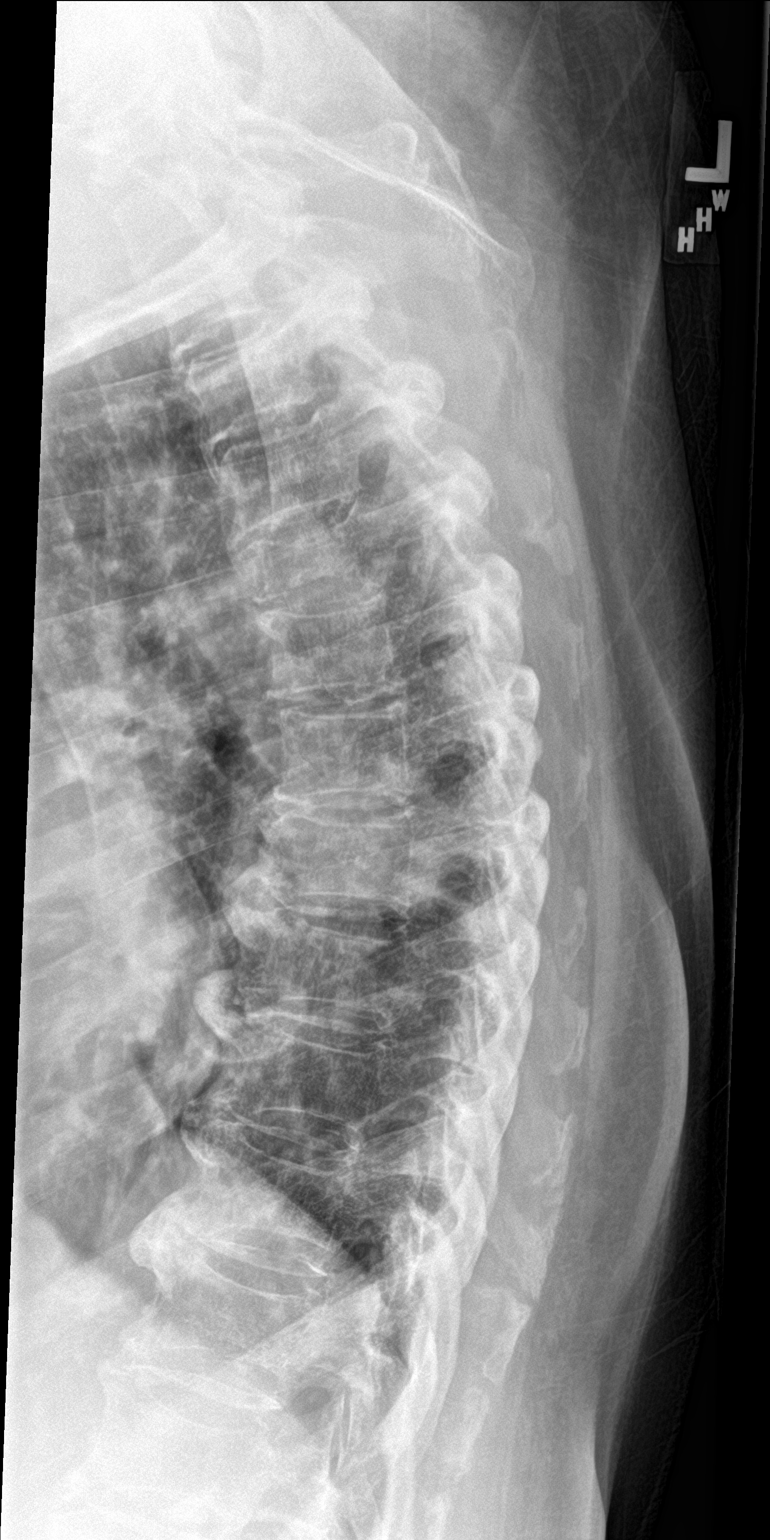

[t-spine swimmers]
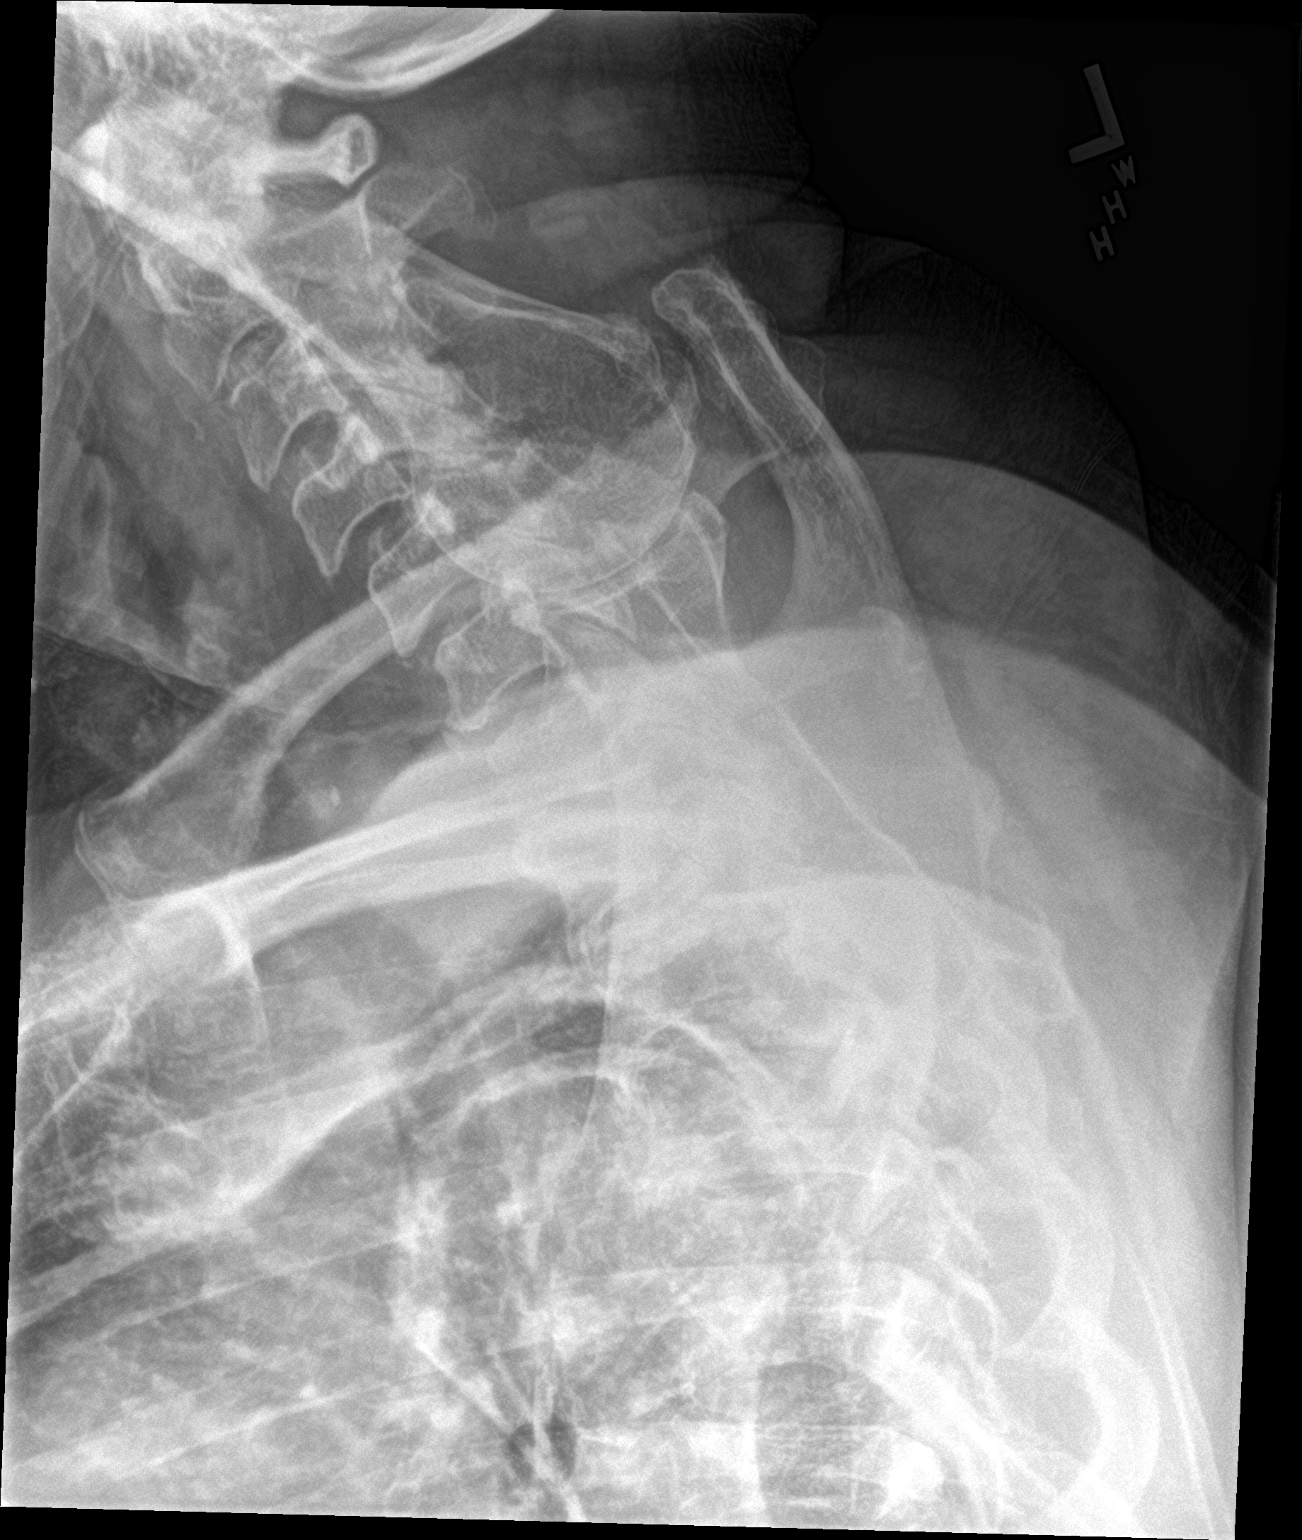

[3 of 3 positions shown; findings below may reference images not displayed]

FINDINGS: Mild compression deformity of upper thoracic vertebra, tentatively
identified T4. Remaining thoracic vertebral body heights are
maintained. Multilevel degenerative change with endplate spurring is
most prominent in the mid and lower thoracic spine. Left perihilar
opacity projects over the midthoracic spine.
IMPRESSION: Mild compression deformity of upper thoracic vertebra, tentatively
identified at T4, acuity uncertain.

Degenerative change in the mid lower thoracic spine.

## 2018-07-04 IMAGING — US US RENAL
1 series · 14 of 25 positions shown · non-contrast
Comparison: None.

CLINICAL DATA: Urinary retention

EXAM:
RENAL / URINARY TRACT ULTRASOUND COMPLETE

[Series 1: us renal · 0.26mm/px · 14 of 28 slices shown]
[im 1/28]
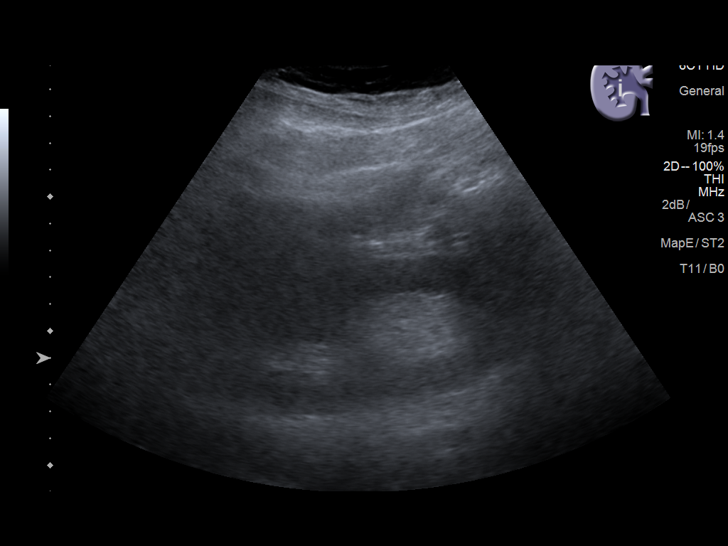
[im 3/28]
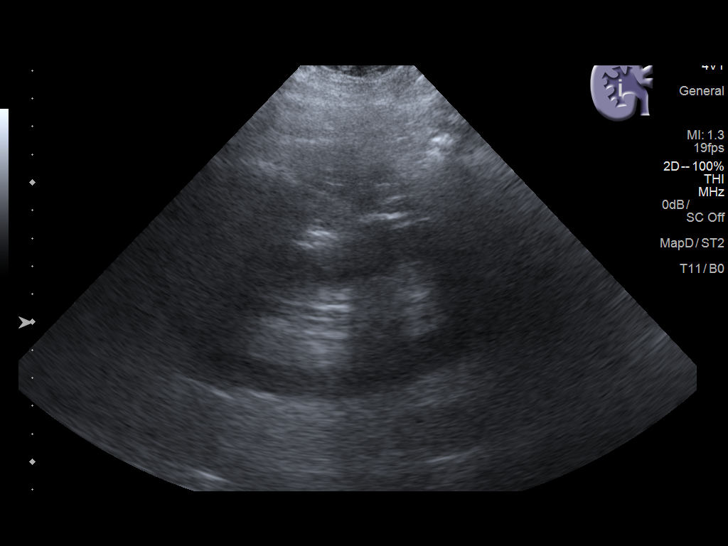
[im 5/28]
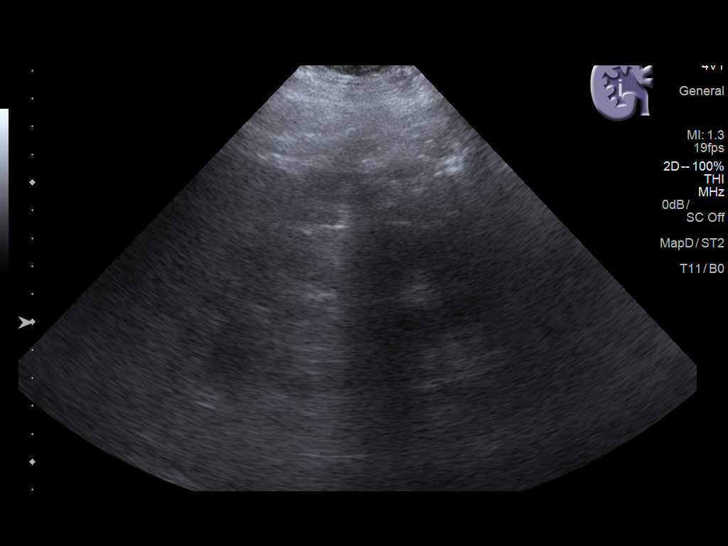
[im 7/28]
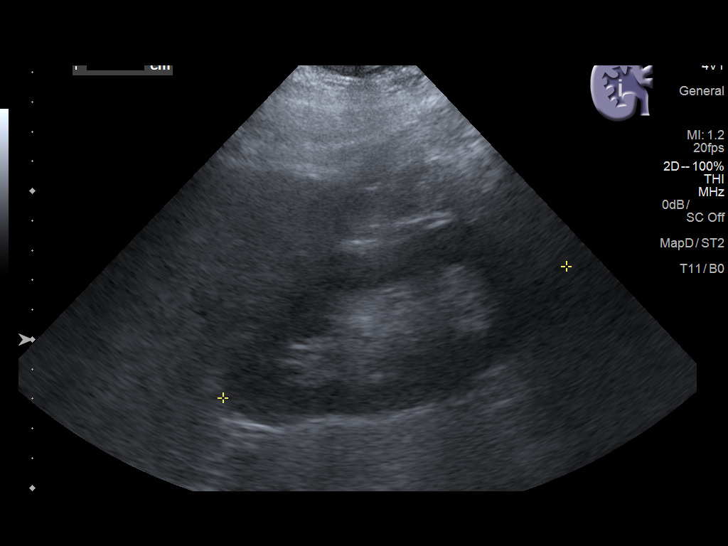
[im 10/28]
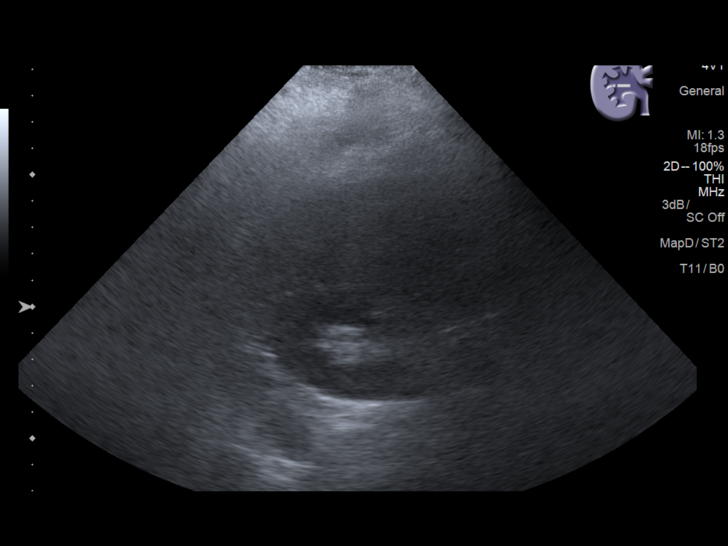
[im 11/28]
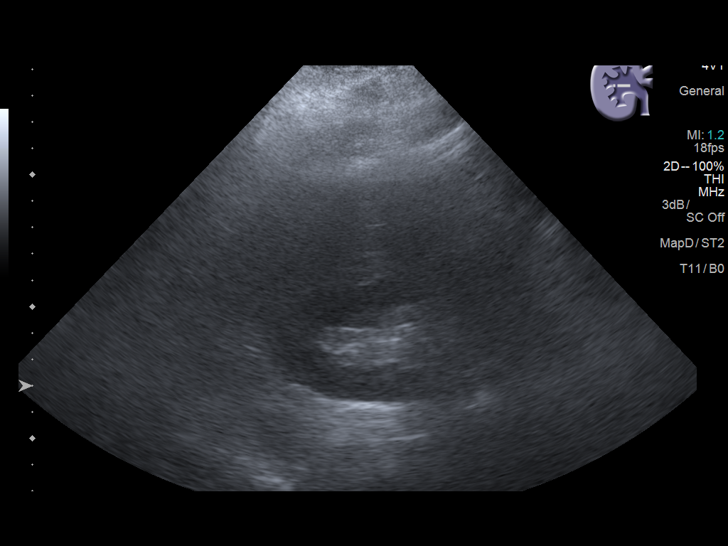
[im 13/28]
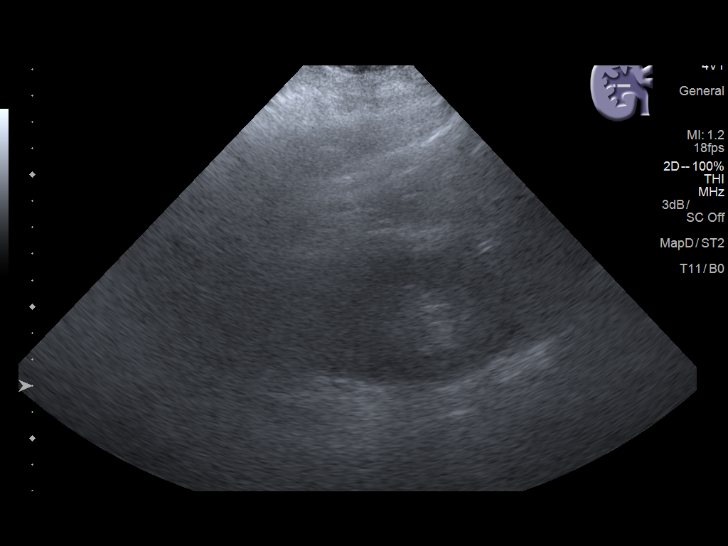
[im 15/28]
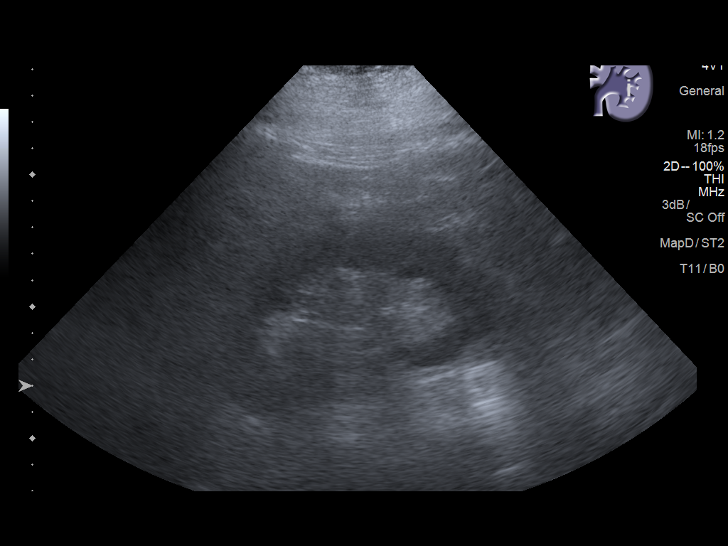
[im 17/28]
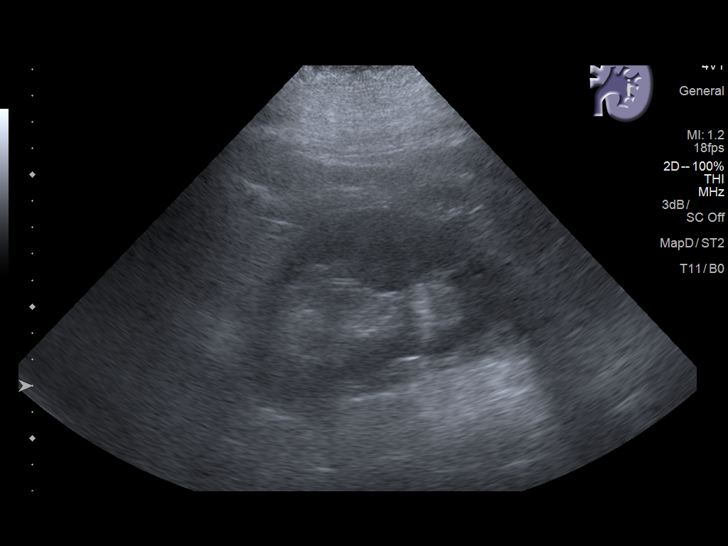
[im 19/28]
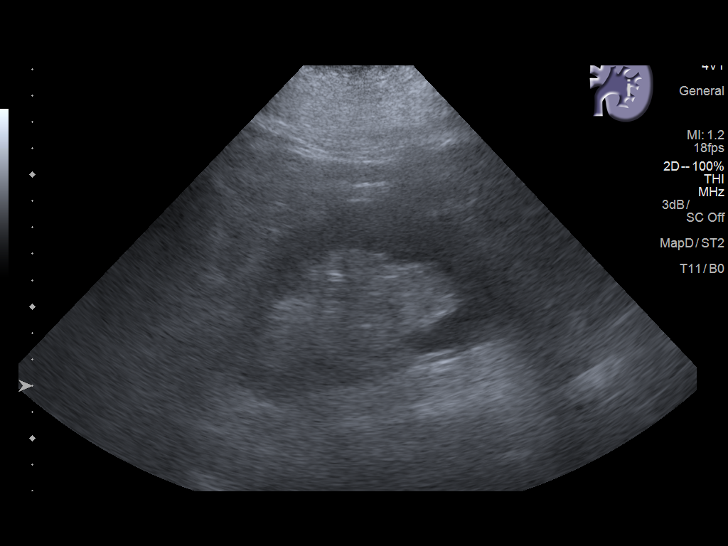
[im 21/28]
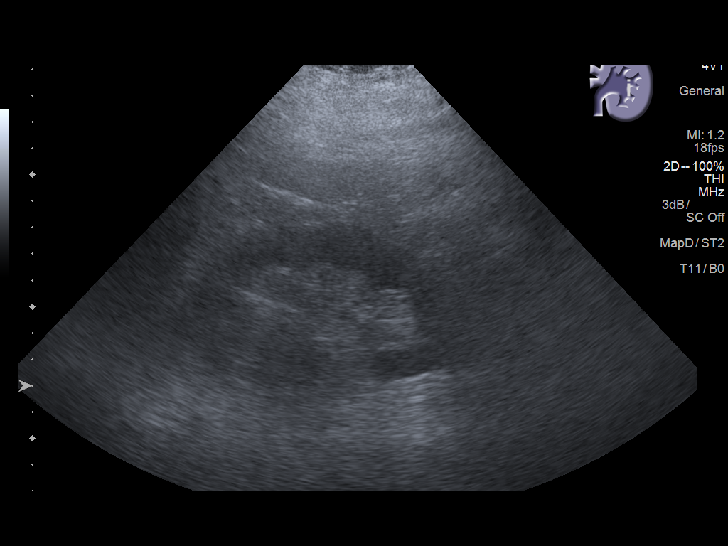
[im 23/28]
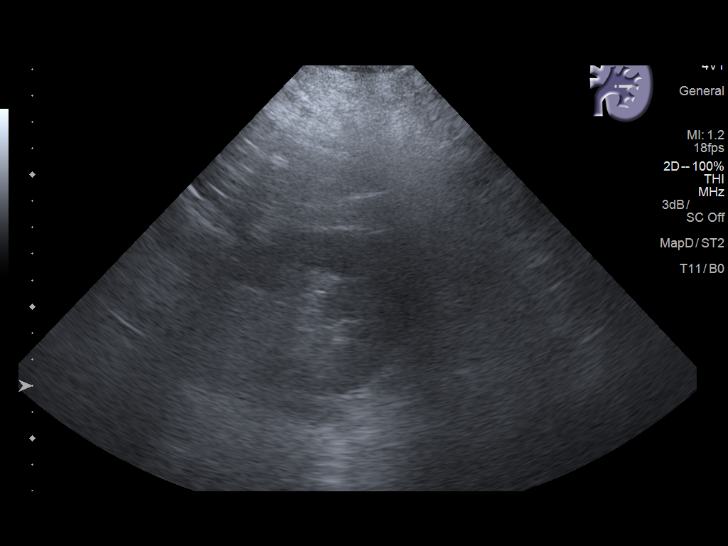
[im 25/28]
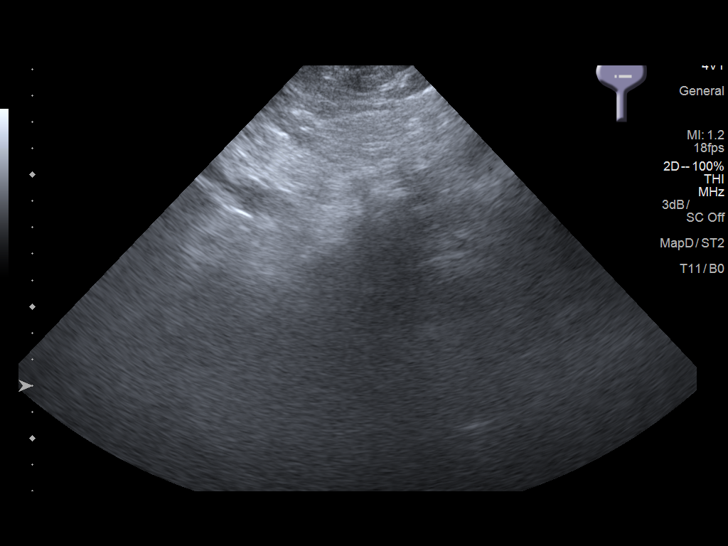
[im 28/28]
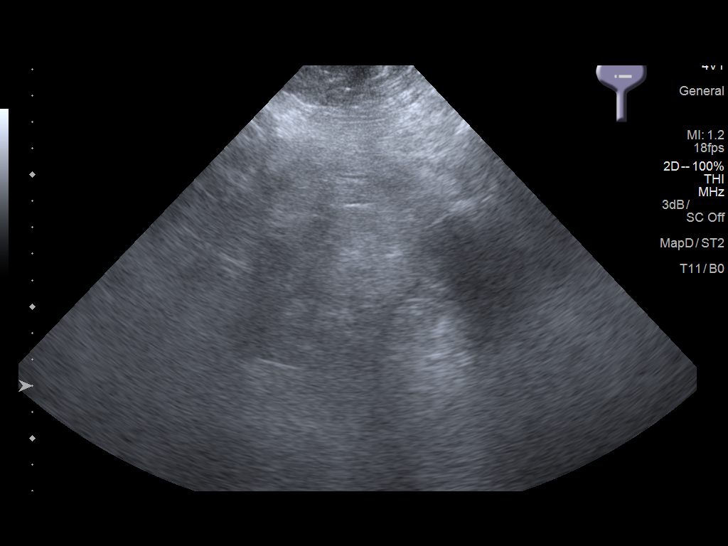

[14 of 25 positions shown; findings below may reference images not displayed]

FINDINGS: Right Kidney:

Length: 12.4 cm. Echogenicity within normal limits. No mass or
hydronephrosis visualized.

Left Kidney:

Length: 12.0 cm. Echogenicity within normal limits. No mass or
hydronephrosis visualized.

Bladder:

Decompressed with Foley catheter in place, not visualized
IMPRESSION: No acute findings.  No hydronephrosis.

## 2023-04-11 ENCOUNTER — Encounter (HOSPITAL_COMMUNITY): Payer: Self-pay | Admitting: Hematology & Oncology
